# Patient Record
Sex: Female | Born: 1980 | Race: White | Hispanic: No | Marital: Married | Smoking: Former smoker
Health system: Southern US, Community
[De-identification: ages and names within clinical notes are randomized; demographics above are authoritative.]

## PROBLEM LIST (undated history)

## (undated) DIAGNOSIS — R51 Headache: Secondary | ICD-10-CM

## (undated) DIAGNOSIS — L2089 Other atopic dermatitis: Secondary | ICD-10-CM

## (undated) HISTORY — DX: Other atopic dermatitis: L20.89

## (undated) HISTORY — DX: Headache: R51

## (undated) HISTORY — PX: BREAST SURGERY: SHX581

---

## 2009-10-17 ENCOUNTER — Ambulatory Visit: Payer: Self-pay | Admitting: Family Medicine

## 2009-10-17 DIAGNOSIS — R51 Headache: Secondary | ICD-10-CM

## 2009-10-17 DIAGNOSIS — L2089 Other atopic dermatitis: Secondary | ICD-10-CM

## 2009-10-17 DIAGNOSIS — R519 Headache, unspecified: Secondary | ICD-10-CM | POA: Insufficient documentation

## 2009-10-17 HISTORY — DX: Other atopic dermatitis: L20.89

## 2009-10-17 HISTORY — DX: Headache: R51

## 2009-10-21 ENCOUNTER — Ambulatory Visit: Payer: Self-pay | Admitting: Family Medicine

## 2009-10-24 LAB — CONVERTED CEMR LAB
Calcium: 9.1 mg/dL (ref 8.4–10.5)
Cholesterol: 176 mg/dL (ref 0–200)
Creatinine, Ser: 0.9 mg/dL (ref 0.4–1.2)
Sodium: 138 meq/L (ref 135–145)
Triglycerides: 74 mg/dL (ref 0.0–149.0)

## 2009-12-17 ENCOUNTER — Telehealth: Payer: Self-pay | Admitting: Family Medicine

## 2010-03-20 ENCOUNTER — Telehealth: Payer: Self-pay | Admitting: Family Medicine

## 2010-04-28 NOTE — Assessment & Plan Note (Signed)
Summary: to be est/abd pain/njr   Vital Signs:  Patient profile:   30 year old female Menstrual status:  regular LMP:     10/06/2009 Height:      64 inches Weight:      176 pounds BMI:     30.32 Temp:     99.1 degrees F oral BP sitting:   128 / 80  (left arm) Cuff size:   regular  Vitals Entered By: Kern Reap CMA Duncan Dull) (October 17, 2009 4:01 PM) CC: new to establish LMP (date): 10/06/2009 LMP - Character: normal Menarche (age onset years): 10    Menstrual Status regular Enter LMP: 10/06/2009   CC:  new to establish.  History of Present Illness: Alyssa [name] is a 30 year old single female, nonsmoker, who comes in today as a new patient for evaluation of nausea x 3 months.  States she has nausea for the past two to 3 months.  She states is always worse at night when she lies down.  She has a history of lactase deficiency, but in the, past it's been a relative deficiency, and she's been able to consume some lactose, but not much.  she states that during the day.  She feels well.  It's just when she lies down.....Marland Kitchen also is not related to any particular food.  She eats  GI review of systems otherwise negative.  She also has eczema she would like checked.  She also has a history of migraine headaches since she was about 12.  Their daily during her menstrual cycle and a couple other times during the month.  She takes over-the-counter medications.  She is on BCPs last Pap March 2011 normal.  Tetanus booster within 6 years  Preventive Screening-Counseling & Management  Alcohol-Tobacco     Smoking Status: quit     Year Quit: 2009  Caffeine-Diet-Exercise     Does Patient Exercise: yes  Hep-HIV-STD-Contraception     Dental Visit-last 6 months yes      Drug Use:  no.    Allergies (verified): 1)  ! Penicillin  Past History:  Past medical, surgical, family and social histories (including risk factors) reviewed, and no changes noted (except as noted below).  Past Medical  History: Headache eczema  Past Surgical History: breast reduction  Family History: Reviewed history and no changes required. Father: heart disease, MI, HTN, DM, High Cholesterol Mother: hx breast cancer Siblings: 1  brother  Social History: Reviewed history and no changes required. Occupation: ultrasound tech Single Alcohol use-yes Drug use-no Regular exercise-yes Drug Use:  no Does Patient Exercise:  yes Dental Care w/in 6 mos.:  yes Smoking Status:  quit  Review of Systems      See HPI  Physical Exam  General:  Well-developed,well-nourished,in no acute distress; alert,appropriate and cooperative throughout examination Head:  Normocephalic and atraumatic without obvious abnormalities. No apparent alopecia or balding. Eyes:  No corneal or conjunctival inflammation noted. EOMI. Perrla. Funduscopic exam benign, without hemorrhages, exudates or papilledema. Vision grossly normal. Ears:  External ear exam shows no significant lesions or deformities.  Otoscopic examination reveals clear canals, tympanic membranes are intact bilaterally without bulging, retraction, inflammation or discharge. Hearing is grossly normal bilaterally. Nose:  External nasal examination shows no deformity or inflammation. Nasal mucosa are pink and moist without lesions or exudates. Mouth:  Oral mucosa and oropharynx without lesions or exudates.  Teeth in good repair. Neck:  No deformities, masses, or tenderness noted. Chest Wall:  No deformities, masses, or tenderness noted. Lungs:  Normal respiratory effort, chest expands symmetrically. Lungs are clear to auscultation, no crackles or wheezes. Heart:  Normal rate and regular rhythm. S1 and S2 normal without gallop, murmur, click, rub or other extra sounds. Abdomen:  Bowel sounds positive,abdomen soft and non-tender without masses, organomegaly or hernias noted. Msk:  No deformity or scoliosis noted of thoracic or lumbar spine.   Pulses:  R and L  carotid,radial,femoral,dorsalis pedis and posterior tibial pulses are full and equal bilaterally Extremities:  No clubbing, cyanosis, edema, or deformity noted with normal full range of motion of all joints.   Neurologic:  No cranial nerve deficits noted. Station and gait are normal. Plantar reflexes are down-going bilaterally. DTRs are symmetrical throughout. Sensory, motor and coordinative functions appear intact. Skin:  Intact without suspicious lesions or rashes Cervical Nodes:  No lymphadenopathy noted Axillary Nodes:  No palpable lymphadenopathy Inguinal Nodes:  No significant adenopathy Psych:  Cognition and judgment appear intact. Alert and cooperative with normal attention span and concentration. No apparent delusions, illusions, hallucinations   Problems:  Medical Problems Added: 1)  Dx of Eczema, Atopic  (ICD-691.8) 2)  Dx of Nausea  (ICD-787.02) 3)  Dx of Headache  (ICD-784.0)  Impression & Recommendations:  Problem # 1:  ECZEMA, ATOPIC (ICD-691.8) Assessment New  Her updated medication list for this problem includes:    Triamcinolone Acetonide 0.5 % Oint (Triamcinolone acetonide) .Marland Kitchen... Apply at bedtime  Orders: Prescription Created Electronically [phone])  Problem # 2:  NAUSEA (ICD-787.02) Assessment: New  Orders: Prescription Created Electronically [phone])  Problem # 3:  HEADACHE (ICD-784.0) Assessment: New  Complete Medication List: 1)  Yaz 3-0.02 Mg Tabs (Drospirenone-ethinyl estradiol) .... Take one tab by mouth once daily 2)  Omeprazole 20 Mg Tbec (Omeprazole) .... Take 1 tablet by mouth two times a day 3)  Triamcinolone Acetonide 0.5 % Oint (Triamcinolone acetonide) .... Apply at bedtime  Patient Instructions: 1)  4-y eczema, I would use a combination of udder cream and triamcinolone cream.  Small amounts at bedtime, and wear white cotton gloves. 2)  Dietary wise,  stay on a complete lactose-free diet and begin Prilosec 20 mg b.i.d..  If this stops her  symptoms, then continue the above is not after a week to 10 days.  Call us and we will get u  set up for a GI consulta day or 3)  Continue to use the over-the-counter medications for your migraine headaches.  If however, it does not work or you would like to discuss other options.  Call me Prescriptions: TRIAMCINOLONE ACETONIDE 0.5 % OINT (TRIAMCINOLONE ACETONIDE) apply at bedtime  #60 gr. x 3   Entered and Authorized by:   Roderick Pee MD   Signed by:   Roderick Pee MD on 10/17/2009   Method used:   Electronically to        [facility]  [phone]* (retail)       [address]       [place], [place]  [address]       Ph: [phone] or [phone]       Fax: [phone]   RxID:   [id number] OMEPRAZOLE 20 MG TBEC (OMEPRAZOLE) Take 1 tablet by mouth two times a day  #60 x 3   Entered and Authorized by:   Roderick Pee MD   Signed by:   Roderick Pee MD on 10/17/2009   Method used:   Electronically to  [facility]  [address]  [phone]* (retail)       [address]       [place], [place]  [address]       Ph: [phone] or [phone]       Fax: [phone]   RxID:   [id number]

## 2010-04-28 NOTE — Progress Notes (Signed)
Summary: Topamax?  Phone Note Call from Patient Call back at Home Phone [phone]   Caller: Patient Call For: Alyssa Pee MD Summary of Call: Pt is having more migraines, and would like to have a prescription for Topamax.  Nicolette Bang ([facility]). Initial call taken by: Lynann Beaver CMA,  December 17, 2009 10:37 AM  Follow-up for Phone Call        Topamax 50 mg, dispensed 30, directions one nightly no refills.  Follow-up office visit two weeks after starting medication Follow-up by: Alyssa Pee MD,  December 17, 2009 10:46 AM    New/Updated Medications: TOPAMAX 50 MG TABS (TOPIRAMATE) one by mouth q hs Prescriptions: TOPAMAX 50 MG TABS (TOPIRAMATE) one by mouth q hs  #30 x 0   Entered by:   Lynann Beaver CMA   Authorized by:   Alyssa Pee MD   Signed by:   Lynann Beaver CMA on 12/17/2009   Method used:   Electronically to        [facility]  [phone]* (retail)       [address]       [place], [place]  [address]       Ph: [phone] or [phone]       Fax: [phone]   RxID:   [id number]

## 2010-04-30 NOTE — Progress Notes (Signed)
Summary: diuretic?  Phone Note Call from Patient Call back at Home Phone [phone]   Caller: Patient Call For: Roderick Pee MD Summary of Call: Pt left message asking for fluid pill for retained fluid????  Advised pt that she needs to make an appt to see why she might be retaining fluid at her age.  Dr. Tawanna Cooler prefers to see his patients without prescribing over the phone. Initial call taken by: Lynann Beaver CMA AAMA,  March 20, 2010 11:18 AM  Follow-up for Phone Call        Fleet Contras please call Follow-up by: Roderick Pee MD,  March 20, 2010 11:26 AM  Additional Follow-up for Phone Call Additional follow up Details #1::        patient is complaing of ankle swelling during her period.  no SOB or elevated blood pressure. Additional Follow-up by: Kern Reap CMA Duncan Dull),  March 20, 2010 12:52 PM    Additional Follow-up for Phone Call Additional follow up Details #2::    patient was advised to cut back on sodium in her diet.  Also HCTZ per dr todd Follow-up by: Kern Reap CMA Duncan Dull),  March 20, 2010 12:53 PM  New/Updated Medications: HYDROCHLOROTHIAZIDE 25 MG TABS (HYDROCHLOROTHIAZIDE) take one tab once daily 10 days before period Prescriptions: HYDROCHLOROTHIAZIDE 25 MG TABS (HYDROCHLOROTHIAZIDE) take one tab once daily 10 days before period  #100 x 3   Entered by:   Kern Reap CMA (AAMA)   Authorized by:   Roderick Pee MD   Signed by:   Kern Reap CMA (AAMA) on 03/20/2010   Method used:   Electronically to        [facility]  [phone]* (retail)       [address]       [place], [place]  [address]       Ph: [phone] or [phone]       Fax: [phone]   RxID:   [id number]

## 2010-05-21 ENCOUNTER — Other Ambulatory Visit: Payer: Self-pay | Admitting: Family Medicine

## 2010-09-03 ENCOUNTER — Other Ambulatory Visit: Payer: Self-pay | Admitting: Family Medicine

## 2010-10-26 ENCOUNTER — Other Ambulatory Visit: Payer: Self-pay | Admitting: Family Medicine

## 2010-10-26 ENCOUNTER — Other Ambulatory Visit (INDEPENDENT_AMBULATORY_CARE_PROVIDER_SITE_OTHER): Payer: BC Managed Care – PPO

## 2010-10-26 DIAGNOSIS — Z0000 Encounter for general adult medical examination without abnormal findings: Secondary | ICD-10-CM

## 2010-10-26 LAB — URINALYSIS
Hgb urine dipstick: NEGATIVE
Nitrite: NEGATIVE
Specific Gravity, Urine: 1.025 (ref 1.000–1.030)
Total Protein, Urine: NEGATIVE
Urobilinogen, UA: 0.2 (ref 0.0–1.0)

## 2010-10-26 LAB — BASIC METABOLIC PANEL
BUN: 15 mg/dL (ref 6–23)
Creatinine, Ser: 0.8 mg/dL (ref 0.4–1.2)
GFR: 92.81 mL/min (ref >60.00)
Glucose, Bld: 80 mg/dL (ref 70–99)
Potassium: 3.4 mEq/L — ABNORMAL LOW (ref 3.5–5.1)

## 2010-10-26 LAB — CBC WITH DIFFERENTIAL/PLATELET
Basophils Relative: 0.4 % (ref 0.0–3.0)
Eosinophils Relative: 0.8 % (ref 0.0–5.0)
HCT: 42.3 % (ref 36.0–46.0)
Hemoglobin: 14.5 g/dL (ref 12.0–15.0)
Lymphs Abs: 3.1 10*3/uL (ref 0.7–4.0)
Monocytes Relative: 11.3 % (ref 3.0–12.0)
Neutro Abs: 2.1 10*3/uL (ref 1.4–7.7)
WBC: 6 10*3/uL (ref 4.5–10.5)

## 2010-10-26 LAB — TSH: TSH: 1.89 u[IU]/mL (ref 0.35–5.50)

## 2010-10-26 LAB — HEPATIC FUNCTION PANEL
Bilirubin, Direct: 0.1 mg/dL (ref 0.0–0.3)
Total Bilirubin: 0.7 mg/dL (ref 0.3–1.2)

## 2010-10-26 LAB — LIPID PANEL: VLDL: 13.2 mg/dL (ref 0.0–40.0)

## 2010-10-28 ENCOUNTER — Encounter: Payer: Self-pay | Admitting: Family Medicine

## 2010-11-02 ENCOUNTER — Encounter: Payer: Self-pay | Admitting: Family Medicine

## 2010-11-02 ENCOUNTER — Ambulatory Visit (INDEPENDENT_AMBULATORY_CARE_PROVIDER_SITE_OTHER): Payer: BC Managed Care – PPO | Admitting: Family Medicine

## 2010-11-02 DIAGNOSIS — R51 Headache: Secondary | ICD-10-CM

## 2010-11-02 DIAGNOSIS — L2089 Other atopic dermatitis: Secondary | ICD-10-CM

## 2010-11-02 DIAGNOSIS — K219 Gastro-esophageal reflux disease without esophagitis: Secondary | ICD-10-CM

## 2010-11-02 MED ORDER — HYDROCHLOROTHIAZIDE 25 MG PO TABS: 25.0000 mg | ORAL_TABLET | Freq: Every day | ORAL | Status: DC

## 2010-11-02 MED ORDER — TRIAMCINOLONE ACETONIDE 0.5 % EX OINT: 1.0000 "application " | TOPICAL_OINTMENT | Freq: Every day | CUTANEOUS | Status: DC

## 2010-11-02 MED ORDER — OMEPRAZOLE 20 MG PO CPDR: 20.0000 mg | DELAYED_RELEASE_CAPSULE | Freq: Every day | ORAL | Status: DC

## 2010-11-02 NOTE — Progress Notes (Signed)
  Subjective:    Patient ID: Alyssa [name], female    DOB: [birth year], 30 y.o.   MRN: [id number]  HPI Silver is a delightful 30 year old female, nonsmoker, who works at [employer], and comes in today for general physical examination because a history of reflux esophagitis migraine headaches and eczema.  Her reflux esophagitis is controlled with Prilosec 20 daily however, if she doesn't take it.  She has symptoms.  We discussed other diagnostic studies however, she is asymptomatic on a medicine.  She has a history of migraine headaches.  We stride, Topamax 50 mg, however, it made her too sleepy and she stopped it.  She currently just takes Motrin or other OTC medications, and they seem to work.  She takes triamcinolone ointment p.r.n. For ectopic eczema.  Usually involving her hands and knees and feet.  She is currently on BCPs via GYN.  The mother and maternal grandmother both had breast cancer.  Her mother was diagnosed with breast cancer at age 68.  We advise to start regular mammography.  Now.  She had a workup and was bracca  negative   Review of Systems General review of systems otherwise negative    Objective:   Physical Exam Well-developed well-nourished, female, in no acute distress.  Examination of the HEENT were negative.  Neck was supple.  Thyroid not enlarged.  No adenopathy.  Lungs are clear.  Cardiac exam negative.  Breast exam shows the breast to be mostly symmetrical.  There is scarring from previous reduction mammoplasty.  I can palpate no abnormal lesions.  Abdominal exam is negative.  Extremities normal.  Skin normal.  Peripheral pulses normal       Assessment & Plan:  Healthy female.  Migraine headaches, currently controlled with over-the-counter medication.  An eczema.  Continue Kenalog ointment p.r.n.  Reflux esophagitis.  Continue Prilosec 20 mg daily.  Fluid retention.   thiazide 25 mg p.r.n.  Positive family history of breast cancer.  Recommend  BSE monthly and a new mammography starting now

## 2010-11-02 NOTE — Patient Instructions (Signed)
Continue your current good health habits return in one year, sooner if any problems.  Call the breast Center and get set up for a mammogram

## 2010-11-04 ENCOUNTER — Other Ambulatory Visit: Payer: Self-pay | Admitting: *Deleted

## 2010-11-04 DIAGNOSIS — L2089 Other atopic dermatitis: Secondary | ICD-10-CM

## 2010-11-04 MED ORDER — TRIAMCINOLONE ACETONIDE 0.5 % EX OINT: 1.0000 "application " | TOPICAL_OINTMENT | Freq: Two times a day (BID) | CUTANEOUS | Status: DC

## 2011-02-25 ENCOUNTER — Other Ambulatory Visit: Payer: Self-pay | Admitting: Family Medicine

## 2011-04-08 ENCOUNTER — Other Ambulatory Visit: Payer: Self-pay | Admitting: *Deleted

## 2011-04-08 DIAGNOSIS — R51 Headache: Secondary | ICD-10-CM

## 2011-04-08 MED ORDER — HYDROCHLOROTHIAZIDE 25 MG PO TABS: 25.0000 mg | ORAL_TABLET | Freq: Every day | ORAL | Status: DC

## 2011-08-28 ENCOUNTER — Other Ambulatory Visit: Payer: Self-pay | Admitting: Family Medicine

## 2011-09-26 ENCOUNTER — Other Ambulatory Visit: Payer: Self-pay | Admitting: Family Medicine

## 2011-10-04 ENCOUNTER — Telehealth: Payer: Self-pay | Admitting: Family Medicine

## 2011-10-04 DIAGNOSIS — R51 Headache: Secondary | ICD-10-CM

## 2011-10-04 DIAGNOSIS — K219 Gastro-esophageal reflux disease without esophagitis: Secondary | ICD-10-CM

## 2011-10-04 NOTE — Telephone Encounter (Signed)
Caller: Alyssa [name]/Patient; Phone Number: [phone]; Message from caller: Her insurance has changed and she now has access to [facility].  Asking that scripts for HCTZ and Omeprazole be sent for a 90 day supply.  The fax # is [phone] for electronic faxing.

## 2011-10-05 MED ORDER — HYDROCHLOROTHIAZIDE 25 MG PO TABS: 25.0000 mg | ORAL_TABLET | Freq: Every day | ORAL | Status: DC

## 2011-10-05 MED ORDER — OMEPRAZOLE 20 MG PO CPDR: 20.0000 mg | DELAYED_RELEASE_CAPSULE | Freq: Every day | ORAL | Status: DC

## 2011-11-02 ENCOUNTER — Telehealth: Payer: Self-pay | Admitting: Family Medicine

## 2011-11-02 MED ORDER — OMEPRAZOLE 20 MG PO CPDR: 20.0000 mg | DELAYED_RELEASE_CAPSULE | Freq: Two times a day (BID) | ORAL | Status: DC

## 2011-11-02 NOTE — Telephone Encounter (Signed)
confidential Office Message [address] [place], [place] [address] p. [phone] f. [phone] To: [facility] (After Hours Triage) Fax: [phone] From: [facility] Date/ Time: 11/01/2011 6:01 PM Taken By: Alyssa Bow, RN Caller: Alyssa [name] Facility: not collected Patient: Alyssa, [name] DOB: 1980-09-14 Phone: [phone] Reason for Call: Caller: Alyssa [name]/Patient; PCP: Alyssa [name] ; CB#: [phone]; ; ; Call regarding Omeprazole Quantity Not Correct, Pt needs quantity #180 due to taking twice a day. RX written for daily per PT. Advised Patient to follow up with Office when reopens on 8-6. Patient verbalized understanding.

## 2011-11-02 NOTE — Telephone Encounter (Signed)
Refill sent.  Office visit for more refills.

## 2011-12-01 ENCOUNTER — Telehealth: Payer: Self-pay | Admitting: Family Medicine

## 2011-12-01 DIAGNOSIS — Z0000 Encounter for general adult medical examination without abnormal findings: Secondary | ICD-10-CM

## 2011-12-01 NOTE — Telephone Encounter (Signed)
Pt called and has sch a cpx for 02/07/12 at 2:45pm. Pt is req to get her cpx labs done at [facility] because pt has to be at work at 7:30am, so its more convenient for the pts schd. Pls order labs for [facility].

## 2012-01-25 ENCOUNTER — Telehealth: Payer: Self-pay | Admitting: Family Medicine

## 2012-01-25 NOTE — Telephone Encounter (Signed)
Error/kjh 

## 2012-02-02 ENCOUNTER — Other Ambulatory Visit (INDEPENDENT_AMBULATORY_CARE_PROVIDER_SITE_OTHER): Payer: No Typology Code available for payment source

## 2012-02-02 DIAGNOSIS — Z0000 Encounter for general adult medical examination without abnormal findings: Secondary | ICD-10-CM

## 2012-02-02 LAB — CBC WITH DIFFERENTIAL/PLATELET
Basophils Relative: 0.5 % (ref 0.0–3.0)
Eosinophils Absolute: 0 10*3/uL (ref 0.0–0.7)
Eosinophils Relative: 0.5 % (ref 0.0–5.0)
Hemoglobin: 14.6 g/dL (ref 12.0–15.0)
Lymphocytes Relative: 42.1 % (ref 12.0–46.0)
MCHC: 33.9 g/dL (ref 30.0–36.0)
Monocytes Relative: 11.6 % (ref 3.0–12.0)
Neutrophils Relative %: 45.3 % (ref 43.0–77.0)
RBC: 4.7 Mil/uL (ref 3.87–5.11)
WBC: 4.9 10*3/uL (ref 4.5–10.5)

## 2012-02-02 LAB — URINALYSIS
Ketones, ur: NEGATIVE
Specific Gravity, Urine: 1.025 (ref 1.000–1.030)
Urine Glucose: NEGATIVE
Urobilinogen, UA: 0.2 (ref 0.0–1.0)

## 2012-02-02 LAB — BASIC METABOLIC PANEL
BUN: 15 mg/dL (ref 6–23)
GFR: 77.89 mL/min (ref >60.00)
Potassium: 4.3 mEq/L (ref 3.5–5.1)
Sodium: 139 mEq/L (ref 135–145)

## 2012-02-02 LAB — LIPID PANEL
Cholesterol: 213 mg/dL — ABNORMAL HIGH (ref 0–200)
VLDL: 16 mg/dL (ref 0.0–40.0)

## 2012-02-02 LAB — HEPATIC FUNCTION PANEL
Albumin: 3.9 g/dL (ref 3.5–5.2)
Total Protein: 6.9 g/dL (ref 6.0–8.3)

## 2012-02-02 LAB — TSH: TSH: 1.32 u[IU]/mL (ref 0.35–5.50)

## 2012-02-07 ENCOUNTER — Ambulatory Visit (INDEPENDENT_AMBULATORY_CARE_PROVIDER_SITE_OTHER): Payer: No Typology Code available for payment source | Admitting: Family Medicine

## 2012-02-07 ENCOUNTER — Encounter: Payer: Self-pay | Admitting: Family Medicine

## 2012-02-07 VITALS — BP 120/80 | Temp 99.2°F | Wt 207.0 lb

## 2012-02-07 DIAGNOSIS — L2089 Other atopic dermatitis: Secondary | ICD-10-CM

## 2012-02-07 DIAGNOSIS — K219 Gastro-esophageal reflux disease without esophagitis: Secondary | ICD-10-CM

## 2012-02-07 DIAGNOSIS — Z803 Family history of malignant neoplasm of breast: Secondary | ICD-10-CM

## 2012-02-07 DIAGNOSIS — R51 Headache: Secondary | ICD-10-CM

## 2012-02-07 DIAGNOSIS — K120 Recurrent oral aphthae: Secondary | ICD-10-CM | POA: Insufficient documentation

## 2012-02-07 DIAGNOSIS — N946 Dysmenorrhea, unspecified: Secondary | ICD-10-CM

## 2012-02-07 MED ORDER — TRIAMCINOLONE ACETONIDE 0.5 % EX OINT: 1.0000 "application " | TOPICAL_OINTMENT | Freq: Two times a day (BID) | CUTANEOUS | Status: DC

## 2012-02-07 MED ORDER — OMEPRAZOLE 20 MG PO CPDR: 20.0000 mg | DELAYED_RELEASE_CAPSULE | Freq: Two times a day (BID) | ORAL | Status: DC

## 2012-02-07 MED ORDER — ACYCLOVIR 200 MG PO CAPS: ORAL_CAPSULE | ORAL | Status: DC

## 2012-02-07 MED ORDER — HYDROCHLOROTHIAZIDE 25 MG PO TABS: 25.0000 mg | ORAL_TABLET | Freq: Every day | ORAL | Status: DC

## 2012-02-07 NOTE — Progress Notes (Signed)
  Subjective:    Patient ID: Alyssa [name], female    DOB: 1980/12/03, 31 y.o.   MRN: [id number]  HPI Alyssa [name] is a 31 year old single female nonsmoker who comes in today for evaluation of reflux esophagitis, recurrent canker sores, eczema.  She is on Wellbutrin 300 mg daily by her GYN along with hydrochlorothiazide for premenstrual fluid retention. She takes Prilosec 20 mg daily for reflux uses Kenalog when necessary for eczema. Her GYN recently changed her BCPs however she's having trouble with weight gain. Her weight today was 207 pounds. Suggested she talk with Dr. Jackelyn Knife about an IUD  She also has recurrent canker sores once notice anything else she can try.  She gets regular eye his care, dental care, BSE monthly,,,,,,,,,, history of reduction mammoplasty,,,,,,,, tetanus 2013 seasonal flu shot 2013.  Her mother was diagnosed at age 49 with breast cancer also her maternal mother had breast cancer her mother is br,,, negative   Review of Systems  Constitutional: Negative.   HENT: Negative.   Eyes: Negative.   Respiratory: Negative.   Cardiovascular: Negative.   Gastrointestinal: Negative.   Genitourinary: Negative.   Musculoskeletal: Negative.   Neurological: Negative.   Hematological: Negative.   Psychiatric/Behavioral: Negative.        Objective:   Physical Exam  Constitutional: She appears well-developed and well-nourished.  HENT:  Head: Normocephalic and atraumatic.  Right Ear: External ear normal.  Left Ear: External ear normal.  Nose: Nose normal.  Mouth/Throat: Oropharynx is clear and moist.  Eyes: EOM are normal. Pupils are equal, round, and reactive to light.  Neck: Normal range of motion. Neck supple. No thyromegaly present.  Cardiovascular: Normal rate, regular rhythm, normal heart sounds and intact distal pulses.  Exam reveals no gallop and no friction rub.   No murmur heard. Pulmonary/Chest: Effort normal and breath sounds normal.  Abdominal: Soft.  Bowel sounds are normal. She exhibits no distension and no mass. There is no tenderness. There is no rebound.  Musculoskeletal: Normal range of motion.  Lymphadenopathy:    She has no cervical adenopathy.  Neurological: She is alert. She has normal reflexes. No cranial nerve deficit. She exhibits normal muscle tone. Coordination normal.  Skin: Skin is warm and dry.       Total body skin exam normal  Psychiatric: She has a normal mood and affect. Her behavior is normal. Judgment and thought content normal.          Assessment & Plan:  Healthy female  Reflux esophagitis continue Prilosec  Dysmenorrhea continue BCPs Wellbutrin hydrochlorothiazide discuss with GYN an IUD  Overweight discussed diet exercise and weight loss  Canker sores a trial of Zovirax  Family history of breast cancer mother and grandmother advised BSE monthly begin mammography at age 63

## 2012-02-07 NOTE — Patient Instructions (Signed)
Continue your current medications  Add acyclovir 1,,,,,,,,,,,,,,3 times daily when necessary for canker sores  Remember to check your freckles and moles monthly wear SPF 50 sunscreens  Do a thorough breast exam monthly  Return if you suspect any changes or not sure and I would recommend beginning mammography at age 31

## 2012-09-06 ENCOUNTER — Other Ambulatory Visit: Payer: Self-pay | Admitting: Family Medicine

## 2012-11-17 ENCOUNTER — Telehealth: Payer: Self-pay | Admitting: Family Medicine

## 2012-11-17 DIAGNOSIS — Z0000 Encounter for general adult medical examination without abnormal findings: Secondary | ICD-10-CM

## 2012-11-17 NOTE — Telephone Encounter (Signed)
For work purposes, pt needs to go to [facility]  for her cpe labs. CPE  Feb 12, 2013. Can you put in?

## 2013-02-01 ENCOUNTER — Other Ambulatory Visit: Payer: Self-pay

## 2013-02-05 ENCOUNTER — Other Ambulatory Visit (INDEPENDENT_AMBULATORY_CARE_PROVIDER_SITE_OTHER): Payer: No Typology Code available for payment source

## 2013-02-05 DIAGNOSIS — Z0000 Encounter for general adult medical examination without abnormal findings: Secondary | ICD-10-CM

## 2013-02-05 LAB — CBC WITH DIFFERENTIAL/PLATELET
Basophils Absolute: 0 10*3/uL (ref 0.0–0.1)
Basophils Relative: 0.5 % (ref 0.0–3.0)
Eosinophils Absolute: 0 10*3/uL (ref 0.0–0.7)
Lymphocytes Relative: 35 % (ref 12.0–46.0)
MCHC: 34.7 g/dL (ref 30.0–36.0)
MCV: 92.9 fl (ref 78.0–100.0)
Monocytes Absolute: 0.5 10*3/uL (ref 0.1–1.0)
Neutro Abs: 2.8 10*3/uL (ref 1.4–7.7)
Neutrophils Relative %: 54 % (ref 43.0–77.0)
RBC: 4.58 Mil/uL (ref 3.87–5.11)
RDW: 12.2 % (ref 11.5–14.6)

## 2013-02-05 LAB — LIPID PANEL
Cholesterol: 186 mg/dL (ref 0–200)
HDL: 48.7 mg/dL (ref >39.00)
Total CHOL/HDL Ratio: 4
Triglycerides: 28 mg/dL (ref 0.0–149.0)

## 2013-02-05 LAB — HEPATIC FUNCTION PANEL
AST: 16 U/L (ref 0–37)
Albumin: 4.5 g/dL (ref 3.5–5.2)
Alkaline Phosphatase: 29 U/L — ABNORMAL LOW (ref 39–117)
Bilirubin, Direct: 0 mg/dL (ref 0.0–0.3)
Total Protein: 7.2 g/dL (ref 6.0–8.3)

## 2013-02-05 LAB — BASIC METABOLIC PANEL
CO2: 25 mEq/L (ref 19–32)
Calcium: 9.3 mg/dL (ref 8.4–10.5)
Creatinine, Ser: 0.9 mg/dL (ref 0.4–1.2)
GFR: 77.41 mL/min (ref >60.00)
Sodium: 139 mEq/L (ref 135–145)

## 2013-02-05 LAB — URINALYSIS
Bilirubin Urine: NEGATIVE
Ketones, ur: NEGATIVE
Leukocytes, UA: NEGATIVE
Specific Gravity, Urine: 1.025 (ref 1.000–1.030)
Total Protein, Urine: NEGATIVE
Urine Glucose: NEGATIVE
pH: 6 (ref 5.0–8.0)

## 2013-02-12 ENCOUNTER — Other Ambulatory Visit: Payer: Self-pay | Admitting: *Deleted

## 2013-02-12 ENCOUNTER — Encounter: Payer: No Typology Code available for payment source | Admitting: Family Medicine

## 2013-02-12 DIAGNOSIS — K219 Gastro-esophageal reflux disease without esophagitis: Secondary | ICD-10-CM

## 2013-02-12 DIAGNOSIS — N946 Dysmenorrhea, unspecified: Secondary | ICD-10-CM

## 2013-02-12 MED ORDER — OMEPRAZOLE 20 MG PO CPDR: 20.0000 mg | DELAYED_RELEASE_CAPSULE | Freq: Two times a day (BID) | ORAL | Status: DC

## 2013-02-12 MED ORDER — HYDROCHLOROTHIAZIDE 25 MG PO TABS: 25.0000 mg | ORAL_TABLET | Freq: Every day | ORAL | Status: DC

## 2013-02-12 MED ORDER — ACYCLOVIR 200 MG PO CAPS: ORAL_CAPSULE | ORAL | Status: DC

## 2013-03-05 ENCOUNTER — Telehealth: Payer: Self-pay | Admitting: Family Medicine

## 2013-03-05 ENCOUNTER — Ambulatory Visit (INDEPENDENT_AMBULATORY_CARE_PROVIDER_SITE_OTHER): Payer: BC Managed Care – PPO | Admitting: Family Medicine

## 2013-03-05 ENCOUNTER — Encounter: Payer: Self-pay | Admitting: Family Medicine

## 2013-03-05 VITALS — BP 120/80 | Temp 98.6°F | Ht 64.25 in | Wt 186.0 lb

## 2013-03-05 DIAGNOSIS — R51 Headache: Secondary | ICD-10-CM

## 2013-03-05 DIAGNOSIS — K120 Recurrent oral aphthae: Secondary | ICD-10-CM

## 2013-03-05 DIAGNOSIS — Z803 Family history of malignant neoplasm of breast: Secondary | ICD-10-CM

## 2013-03-05 DIAGNOSIS — K219 Gastro-esophageal reflux disease without esophagitis: Secondary | ICD-10-CM

## 2013-03-05 DIAGNOSIS — N946 Dysmenorrhea, unspecified: Secondary | ICD-10-CM

## 2013-03-05 MED ORDER — BUPROPION HCL ER (XL) 300 MG PO TB24: 300.0000 mg | ORAL_TABLET | Freq: Every day | ORAL | Status: DC

## 2013-03-05 MED ORDER — RIZATRIPTAN BENZOATE 5 MG PO TBDP: 5.0000 mg | ORAL_TABLET | ORAL | Status: DC | PRN

## 2013-03-05 NOTE — Patient Instructions (Signed)
Continue your current medications  Call Maylon Cos at the genetic center for consultation because her mother was diagnosed of breast cancer at age 33.  Do a thorough breast exam monthly and began and you mammography now  Return sometime this winter for removal of the mole that's Tailor on your left lower abdominal area    Maxalt 5 mg sublingual when necessary at the onset of any migraine

## 2013-03-05 NOTE — Progress Notes (Signed)
Subjective:    Patient ID: Alyssa [name], female    DOB: 17-Aug-1980, 32 y.o.   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 Her mother is a survivor. Her mother's breast cancer presented as dimpling. Her mother is now 83. She's never had angina headache evaluation.  She also has migraine headaches about twice a month for which she's taken over-the-counter Motrin. However the Motrin is causing a lot of bruising.  She has recurrent canker sores which she takes acyclovir when necessary  She also takes Wellbutrin 300 mg daily.  She's off her BCPs she had a Mirena IUD inserted by her GYN  She also takes hydrochlorothiazide 25 mg daily for fluid retention and Kenalog 0.5% cream for eczema.  She's tried Topamax in the past for migraines but she had side effects. She's never tried Maxalt   Review of Systems  Constitutional: Negative.   HENT: Negative.   Eyes: Negative.   Respiratory: Negative.   Cardiovascular: Negative.   Gastrointestinal: Negative.   Endocrine: Negative.   Genitourinary: Negative.   Musculoskeletal: Negative.   Allergic/Immunologic: Negative.   Neurological: Negative.   Hematological: Negative.   Psychiatric/Behavioral: Negative.        Objective:   Physical Exam  Constitutional: She is oriented to person, place, and time. She appears well-developed and well-nourished.  HENT:  Head: Normocephalic and atraumatic.  Right Ear: External ear normal.  Left Ear: External ear normal.  Nose: Nose normal.  Mouth/Throat: Oropharynx is clear and moist.  Eyes: EOM are normal. Pupils are equal, round, and reactive to light.  Neck: Normal range of motion. Neck supple. No thyromegaly present.    Cardiovascular: Normal rate, regular rhythm, normal heart sounds and intact distal pulses.  Exam reveals no gallop and no friction rub.   No murmur heard. Pulmonary/Chest: Effort normal and breath sounds normal.  Abdominal: Soft. Bowel sounds are normal. She exhibits no distension and no mass. There is no tenderness. There is no rebound.  Genitourinary:  Bilateral breast exam shows scars from the 3 to the 9:00 position inferiorly from previous reduction mammoplasty. The components of BSE were taught and at age 33 she was advised to begin mammography since her mother had breast cancer at age 37. Thorough breast exam was normal  Musculoskeletal: Normal range of motion.  Lymphadenopathy:    She has no cervical adenopathy.  Neurological: She is alert and oriented to person, place, and time. She has normal reflexes. No cranial nerve deficit. She exhibits normal muscle tone. Coordination normal.  Skin: Skin is warm and dry.  Total body skin exam normal except for a 3 mm Mcclenahan lesion anterior left lower abdomen advised to return for removal this winter  Psychiatric: She has a normal mood and affect. Her behavior is normal. Judgment and thought content normal.          Assessment & Plan:  Healthy female  Reflux esophagitis continue Prilosec  Dysmenorrhea resolved with a Mirena IUD  Positive family history of breast cancer in mom at age 27 refer to Maylon Cos at the [facility] for further studies and evaluation  Canker sores acyclovir when necessary  Eczema and triamcinolone cream when necessary  Migraine headaches stop the Motrin  trial of Maxalt

## 2013-03-05 NOTE — Telephone Encounter (Signed)
Pt received your message, and unless you need anything else, pt will see you when she has her mole removed!! Call if you need her, and many thanks!!

## 2013-03-05 NOTE — Progress Notes (Signed)
Pre visit review using our clinic review tool, if applicable. No additional management support is needed unless otherwise documented below in the visit note. 

## 2013-05-11 ENCOUNTER — Other Ambulatory Visit: Payer: Self-pay | Admitting: *Deleted

## 2013-05-11 DIAGNOSIS — N946 Dysmenorrhea, unspecified: Secondary | ICD-10-CM

## 2013-05-11 DIAGNOSIS — K219 Gastro-esophageal reflux disease without esophagitis: Secondary | ICD-10-CM

## 2013-05-11 MED ORDER — HYDROCHLOROTHIAZIDE 25 MG PO TABS: 25.0000 mg | ORAL_TABLET | Freq: Every day | ORAL | Status: DC

## 2013-05-11 MED ORDER — ACYCLOVIR 200 MG PO CAPS: ORAL_CAPSULE | ORAL | Status: DC

## 2013-05-11 MED ORDER — OMEPRAZOLE 20 MG PO CPDR: 20.0000 mg | DELAYED_RELEASE_CAPSULE | Freq: Two times a day (BID) | ORAL | Status: DC

## 2013-06-01 ENCOUNTER — Telehealth: Payer: Self-pay | Admitting: Family Medicine

## 2013-06-01 DIAGNOSIS — N946 Dysmenorrhea, unspecified: Secondary | ICD-10-CM

## 2013-06-01 NOTE — Telephone Encounter (Signed)
Pt states she received a letter from [facility] stating they could not fill the rx because they need verification on the quantity for rxhydrochlorothiazide (HYDRODIURIL) 25 MG tablet,

## 2013-06-04 MED ORDER — HYDROCHLOROTHIAZIDE 25 MG PO TABS: 25.0000 mg | ORAL_TABLET | Freq: Every day | ORAL | Status: DC

## 2013-06-04 NOTE — Telephone Encounter (Signed)
New directions for Rx. Left message on machine for patient.

## 2013-08-24 ENCOUNTER — Telehealth: Payer: Self-pay | Admitting: Family Medicine

## 2013-08-24 NOTE — Telephone Encounter (Signed)
Pt requesting call back from nurse, states she has questions regarding an issue that has been going on with her for a while. Pt states she is noticing that she is having trouble focusing and just has a couple of questions.

## 2013-08-27 NOTE — Telephone Encounter (Signed)
Left message on machine returning patient's call 

## 2013-08-28 NOTE — Telephone Encounter (Signed)
Left message on machine returning patient's call 

## 2013-08-30 NOTE — Telephone Encounter (Signed)
Spoke with patient and an appointment made 

## 2013-09-05 ENCOUNTER — Ambulatory Visit (INDEPENDENT_AMBULATORY_CARE_PROVIDER_SITE_OTHER): Payer: BC Managed Care – PPO | Admitting: Family Medicine

## 2013-09-05 ENCOUNTER — Encounter: Payer: Self-pay | Admitting: Family Medicine

## 2013-09-05 VITALS — BP 120/80

## 2013-09-05 DIAGNOSIS — F988 Other specified behavioral and emotional disorders with onset usually occurring in childhood and adolescence: Secondary | ICD-10-CM

## 2013-09-05 MED ORDER — AMPHETAMINE-DEXTROAMPHET ER 20 MG PO CP24: 20.0000 mg | ORAL_CAPSULE | Freq: Every day | ORAL | Status: DC

## 2013-09-05 NOTE — Patient Instructions (Signed)
Adderall 20 mg long-acting.......Marland Kitchen 1 daily in the morning  Return in 2-3 weeks for followup

## 2013-09-05 NOTE — Progress Notes (Signed)
   Subjective:    Patient ID: Alyssa [name], female    DOB: Sep 09, 1980, 33 y.o.   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 This was noticed in high school. She went to [employer] and did very poorly in reading. She did well in math. She went to [employer] and had difficulty with focusing concentration and again reading.  She has trouble when she has to deal with more than one thing at a time. When she 54 with multiple issues she often times forget switch she's doing.  We discussed the concept of ADD diagnostic and treatment options. She elects of trauma medication for one month prior to any formal testing  She's not taken BCPs her gynecologist for an IUD in. She takes Prilosec twice daily. She uses over-the-counter medication for migraines. She had side effects from the Maxalt. She's also off Wellbutrin. She takes acyclovir when necessary   Review of Systems    review of systems otherwise negative Objective:   Physical Exam  Well-developed well-nourished female no acute distress vital signs stable she's afebrile      Assessment & Plan:  Symptoms consistent with ADD,,,,,,,,, trial of Adderall return in 3 weeks for followup,

## 2013-09-24 ENCOUNTER — Encounter: Payer: Self-pay | Admitting: Family Medicine

## 2013-09-24 ENCOUNTER — Ambulatory Visit (INDEPENDENT_AMBULATORY_CARE_PROVIDER_SITE_OTHER): Payer: BC Managed Care – PPO | Admitting: Family Medicine

## 2013-09-24 ENCOUNTER — Telehealth: Payer: Self-pay | Admitting: Family Medicine

## 2013-09-24 VITALS — BP 120/80 | Temp 98.7°F | Wt 185.0 lb

## 2013-09-24 DIAGNOSIS — F988 Other specified behavioral and emotional disorders with onset usually occurring in childhood and adolescence: Secondary | ICD-10-CM

## 2013-09-24 DIAGNOSIS — Z0000 Encounter for general adult medical examination without abnormal findings: Secondary | ICD-10-CM

## 2013-09-24 MED ORDER — AMPHETAMINE-DEXTROAMPHET ER 20 MG PO CP24: 20.0000 mg | ORAL_CAPSULE | ORAL | Status: DC

## 2013-09-24 MED ORDER — AMPHETAMINE-DEXTROAMPHET ER 20 MG PO CP24: 20.0000 mg | ORAL_CAPSULE | Freq: Every day | ORAL | Status: DC

## 2013-09-24 NOTE — Telephone Encounter (Signed)
Lab orders placed.  

## 2013-09-24 NOTE — Patient Instructions (Signed)
Adderall 20 mg long-acting.............Marland Kitchen. 1 daily in the morning  When you are 2 weeks from being out of your third prescription call and leave a voicemail with Fleet ContrasRachel for refills.

## 2013-09-24 NOTE — Telephone Encounter (Signed)
Pt is going to [facility] for CP[facility] labs in December.

## 2013-09-24 NOTE — Progress Notes (Signed)
   Subjective:    Patient ID: Alyssa [name], female    DOB: 11-Mar-1981, 33 y.o.   MRN: [id number]  HPI Alyssa JoinerRebecca is a 33 year old female nonsmoker who comes in today for followup of adult ADD  Historically she has had ADD since she was a teenager. She was in the [employer] courses for math but had difficulty reading. Her mother has the same problem  We saw her couple weeks ago because she was having a lot of trouble work focusing concentrating and getting things done. We started her on a 20 mg tablet long-acting daily and she comes back today for followup. She says this is a "miracle" drug. It's helped her tremendously no side effects except for slight headache that went away with time    Review of Systems    review of systems negative no weight loss Objective:   Physical Exam  Well-developed well-nourished female no acute distress vital signs stable she's afebrile BP normal 120/80      Assessment & Plan:  Adult ADD............ continue Adderall long-acting 20 mg daily

## 2013-12-06 ENCOUNTER — Telehealth: Payer: Self-pay | Admitting: Family Medicine

## 2013-12-06 MED ORDER — PREDNISONE 20 MG PO TABS: 20.0000 mg | ORAL_TABLET | Freq: Every day | ORAL | Status: DC

## 2013-12-06 MED ORDER — TRAMADOL HCL 50 MG PO TABS: 50.0000 mg | ORAL_TABLET | Freq: Every day | ORAL | Status: DC

## 2013-12-06 NOTE — Telephone Encounter (Signed)
Left detailed message on machine for patient.  Per Dr Tawanna Cooler patient can try Prednisone 20 mg. And Tramadol 50 mg.  Do not take Motrin or ASA.  Patient should try to lay down not sit as much as possible.  She should try this for 2 weeks.  If there is no improvement then she can try PT with Jeanene Erb.

## 2013-12-06 NOTE — Telephone Encounter (Signed)
Having back pain for 3 weeks. Treated with ibuprofen. Then about a week and a halg ago she began to have nerve pain and now has hand and finger numbness. Does not want a pain med or muscle relaxer if possible. Wants to know if a steroid DP might help. Started Naproxen on Saturday with no relief. You can leave a detailed message on her cell if you do not reach her.

## 2014-01-04 ENCOUNTER — Telehealth: Payer: Self-pay | Admitting: Family Medicine

## 2014-01-04 NOTE — Telephone Encounter (Signed)
Pt called to say she need an rx Whooping cough booster  Pharmacy ; [facility] [address]

## 2014-01-07 NOTE — Telephone Encounter (Signed)
Per Dr Tawanna Coolerodd pt can come here for injection.  Please schedule tdap appt

## 2014-01-08 NOTE — Telephone Encounter (Signed)
lmovm for pt to call and schedule a whooping cough booster

## 2014-01-11 ENCOUNTER — Other Ambulatory Visit: Payer: Self-pay

## 2014-02-04 ENCOUNTER — Telehealth: Payer: Self-pay | Admitting: Family Medicine

## 2014-02-04 DIAGNOSIS — F988 Other specified behavioral and emotional disorders with onset usually occurring in childhood and adolescence: Secondary | ICD-10-CM

## 2014-02-04 MED ORDER — AMPHETAMINE-DEXTROAMPHET ER 20 MG PO CP24: 20.0000 mg | ORAL_CAPSULE | Freq: Every day | ORAL | Status: DC

## 2014-02-04 MED ORDER — AMPHETAMINE-DEXTROAMPHET ER 20 MG PO CP24: 20.0000 mg | ORAL_CAPSULE | ORAL | Status: DC

## 2014-02-04 NOTE — Telephone Encounter (Signed)
Pt needs new generic adderall xr 20 mg °

## 2014-02-05 NOTE — Telephone Encounter (Signed)
Rx ready for pick up and Left message on machine for patient   

## 2014-03-11 ENCOUNTER — Encounter: Payer: BC Managed Care – PPO | Admitting: Family Medicine

## 2014-04-30 ENCOUNTER — Telehealth: Payer: Self-pay | Admitting: Family Medicine

## 2014-04-30 DIAGNOSIS — Z0000 Encounter for general adult medical examination without abnormal findings: Secondary | ICD-10-CM

## 2014-04-30 NOTE — Telephone Encounter (Signed)
[facility] wants to go [facility] to have CP[facility] labs drawn.

## 2014-05-01 ENCOUNTER — Encounter: Payer: BC Managed Care – PPO | Admitting: Family Medicine

## 2014-05-01 NOTE — Telephone Encounter (Signed)
Rx ready for pick up. 

## 2014-05-11 ENCOUNTER — Other Ambulatory Visit: Payer: Self-pay | Admitting: Family Medicine

## 2014-05-14 ENCOUNTER — Telehealth: Payer: Self-pay | Admitting: Family Medicine

## 2014-05-14 DIAGNOSIS — F988 Other specified behavioral and emotional disorders with onset usually occurring in childhood and adolescence: Secondary | ICD-10-CM

## 2014-05-14 MED ORDER — AMPHETAMINE-DEXTROAMPHET ER 20 MG PO CP24: 20.0000 mg | ORAL_CAPSULE | ORAL | Status: DC

## 2014-05-14 NOTE — Telephone Encounter (Signed)
Patient need re-fill on amphetamine-dextroamphetamine (ADDERALL XR) 20 MG 24 hr capsule.  She will be out tomorrow.

## 2014-05-14 NOTE — Telephone Encounter (Signed)
Left detailed message Rx's are ready for pickup, Rx's printed and signed by Dr.K

## 2014-07-05 ENCOUNTER — Other Ambulatory Visit: Payer: Self-pay | Admitting: Family Medicine

## 2014-07-16 ENCOUNTER — Other Ambulatory Visit (INDEPENDENT_AMBULATORY_CARE_PROVIDER_SITE_OTHER): Payer: BLUE CROSS/BLUE SHIELD

## 2014-07-16 DIAGNOSIS — Z0000 Encounter for general adult medical examination without abnormal findings: Secondary | ICD-10-CM

## 2014-07-16 LAB — LIPID PANEL
CHOLESTEROL: 200 mg/dL (ref 0–200)
HDL: 43.2 mg/dL (ref >39.00)
LDL CALC: 136 mg/dL — AB (ref 0–99)
NonHDL: 156.8
Total CHOL/HDL Ratio: 5
Triglycerides: 106 mg/dL (ref 0.0–149.0)
VLDL: 21.2 mg/dL (ref 0.0–40.0)

## 2014-07-16 LAB — URINALYSIS
BILIRUBIN URINE: NEGATIVE
Hgb urine dipstick: NEGATIVE
KETONES UR: NEGATIVE
Leukocytes, UA: NEGATIVE
Nitrite: NEGATIVE
PH: 6.5 (ref 5.0–8.0)
Specific Gravity, Urine: 1.015 (ref 1.000–1.030)
TOTAL PROTEIN, URINE-UPE24: NEGATIVE
Urine Glucose: NEGATIVE
Urobilinogen, UA: 1 (ref 0.0–1.0)

## 2014-07-16 LAB — CBC WITH DIFFERENTIAL/PLATELET
Basophils Absolute: 0 10*3/uL (ref 0.0–0.1)
Basophils Relative: 0.5 % (ref 0.0–3.0)
EOS PCT: 0.9 % (ref 0.0–5.0)
Eosinophils Absolute: 0.1 10*3/uL (ref 0.0–0.7)
HEMATOCRIT: 46.1 % — AB (ref 36.0–46.0)
Hemoglobin: 15.8 g/dL — ABNORMAL HIGH (ref 12.0–15.0)
LYMPHS ABS: 2.3 10*3/uL (ref 0.7–4.0)
Lymphocytes Relative: 41.9 % (ref 12.0–46.0)
MCHC: 34.3 g/dL (ref 30.0–36.0)
MCV: 93.4 fl (ref 78.0–100.0)
MONO ABS: 0.6 10*3/uL (ref 0.1–1.0)
MONOS PCT: 11.8 % (ref 3.0–12.0)
NEUTROS ABS: 2.5 10*3/uL (ref 1.4–7.7)
Neutrophils Relative %: 44.9 % (ref 43.0–77.0)
Platelets: 184 10*3/uL (ref 150.0–400.0)
RBC: 4.94 Mil/uL (ref 3.87–5.11)
RDW: 12.4 % (ref 11.5–15.5)
WBC: 5.5 10*3/uL (ref 4.0–10.5)

## 2014-07-16 LAB — BASIC METABOLIC PANEL
BUN: 16 mg/dL (ref 6–23)
CO2: 26 mEq/L (ref 19–32)
Calcium: 9.9 mg/dL (ref 8.4–10.5)
Chloride: 104 mEq/L (ref 96–112)
Creatinine, Ser: 0.85 mg/dL (ref 0.40–1.20)
GFR: 80.93 mL/min (ref >60.00)
GLUCOSE: 91 mg/dL (ref 70–99)
POTASSIUM: 3.8 meq/L (ref 3.5–5.1)
Sodium: 137 mEq/L (ref 135–145)

## 2014-07-16 LAB — TSH: TSH: 1.5 u[IU]/mL (ref 0.35–4.50)

## 2014-07-16 LAB — HEPATIC FUNCTION PANEL
ALBUMIN: 4.5 g/dL (ref 3.5–5.2)
ALT: 16 U/L (ref 0–35)
AST: 16 U/L (ref 0–37)
Alkaline Phosphatase: 32 U/L — ABNORMAL LOW (ref 39–117)
Bilirubin, Direct: 0.1 mg/dL (ref 0.0–0.3)
TOTAL PROTEIN: 6.8 g/dL (ref 6.0–8.3)
Total Bilirubin: 0.5 mg/dL (ref 0.2–1.2)

## 2014-07-22 ENCOUNTER — Ambulatory Visit (INDEPENDENT_AMBULATORY_CARE_PROVIDER_SITE_OTHER): Payer: BLUE CROSS/BLUE SHIELD | Admitting: Family Medicine

## 2014-07-22 ENCOUNTER — Encounter: Payer: Self-pay | Admitting: Family Medicine

## 2014-07-22 VITALS — HR 87 | Temp 98.6°F | Ht 64.0 in | Wt 207.7 lb

## 2014-07-22 DIAGNOSIS — F909 Attention-deficit hyperactivity disorder, unspecified type: Secondary | ICD-10-CM

## 2014-07-22 DIAGNOSIS — Z0000 Encounter for general adult medical examination without abnormal findings: Secondary | ICD-10-CM | POA: Insufficient documentation

## 2014-07-22 DIAGNOSIS — K120 Recurrent oral aphthae: Secondary | ICD-10-CM | POA: Diagnosis not present

## 2014-07-22 DIAGNOSIS — K219 Gastro-esophageal reflux disease without esophagitis: Secondary | ICD-10-CM

## 2014-07-22 DIAGNOSIS — F988 Other specified behavioral and emotional disorders with onset usually occurring in childhood and adolescence: Secondary | ICD-10-CM

## 2014-07-22 MED ORDER — AMPHETAMINE-DEXTROAMPHET ER 20 MG PO CP24: 20.0000 mg | ORAL_CAPSULE | Freq: Every day | ORAL | Status: DC

## 2014-07-22 MED ORDER — AMPHETAMINE-DEXTROAMPHET ER 20 MG PO CP24: 20.0000 mg | ORAL_CAPSULE | ORAL | Status: DC

## 2014-07-22 MED ORDER — OMEPRAZOLE 20 MG PO CPDR: DELAYED_RELEASE_CAPSULE | ORAL | Status: DC

## 2014-07-22 MED ORDER — HYDROCHLOROTHIAZIDE 25 MG PO TABS: ORAL_TABLET | ORAL | Status: DC

## 2014-07-22 MED ORDER — ACYCLOVIR 200 MG PO CAPS: ORAL_CAPSULE | ORAL | Status: DC

## 2014-07-22 NOTE — Progress Notes (Signed)
Pre visit review using our clinic review tool, if applicable. No additional management support is needed unless otherwise documented below in the visit note. 

## 2014-07-22 NOTE — Progress Notes (Signed)
   Subjective:    Patient ID: Alyssa [name], female    DOB: [birth year], 34 y.o.   MRN: [id number]  HPI Alyssa [name] is a 34 year old female nonsmoker who comes in today for general physical examination  She takes Adderall 20 mg long-acting for adult ADD and is functioning very well at work and at home  She also takes Zovirax when necessary for HSV one and Prilosec 20 mg twice a day because of reflux esophagitis  She gets her pelvics and Paps a GYN. BCPs of been discontinued she has a Mirena IUD  Tetanus booster 2015   Review of Systems Review of systems otherwise negative    Objective:   Physical Exam Well-developed well-nourished female no acute distress vital signs stable she's afebrile HEENT were negative neck was supple no adenopathy thyroid normal cardiopulmonary normal abdominal exam normal extremities skin normal extremities normal peripheral pulses normal       Assessment & Plan:  Healthy female  Adult ADD....... continue Adderall  Recurrent HSV one........Marland Kitchen. Zovirax when necessary  Reflux esophagitis......... Prilosec 20 mg twice a day.

## 2014-07-22 NOTE — Patient Instructions (Signed)
Continue current medications  Follow-up in one year sooner if any problems  Call 2 weeks from being out of your third prescriptions for refills  Rachel's extension is [phone]

## 2014-08-14 ENCOUNTER — Ambulatory Visit (INDEPENDENT_AMBULATORY_CARE_PROVIDER_SITE_OTHER): Payer: BLUE CROSS/BLUE SHIELD | Admitting: Family Medicine

## 2014-08-14 ENCOUNTER — Encounter: Payer: Self-pay | Admitting: Family Medicine

## 2014-08-14 VITALS — BP 126/84 | HR 80 | Temp 98.0°F | Ht 64.0 in | Wt 207.0 lb

## 2014-08-14 DIAGNOSIS — J0100 Acute maxillary sinusitis, unspecified: Secondary | ICD-10-CM

## 2014-08-14 MED ORDER — AZITHROMYCIN 250 MG PO TABS: ORAL_TABLET | ORAL | Status: DC

## 2014-08-14 NOTE — Progress Notes (Signed)
   Subjective:    Patient ID: Dicie BeamRebecca Hartness, female    DOB: 1980-05-21, 34 y.o.   MRN: [id number]  HPI Here for one week of sinus pressure, PND, HA, and coughing up green sputum. She had a fever of 99.9 degrees this am. On Tylenol.    Review of Systems  Constitutional: Positive for fever.  HENT: Positive for congestion, postnasal drip and sinus pressure.   Eyes: Negative.   Respiratory: Positive for cough.        Objective:   Physical Exam  Constitutional: She appears well-developed and well-nourished.  HENT:  Right Ear: External ear normal.  Left Ear: External ear normal.  Nose: Nose normal.  Mouth/Throat: Oropharynx is clear and moist.  Eyes: Conjunctivae are normal.  Pulmonary/Chest: Effort normal and breath sounds normal.  Lymphadenopathy:    She has no cervical adenopathy.          Assessment & Plan:  Sinusitis. Add Mucinex D

## 2014-08-14 NOTE — Progress Notes (Signed)
Pre visit review using our clinic review tool, if applicable. No additional management support is needed unless otherwise documented below in the visit note. 

## 2014-10-25 ENCOUNTER — Telehealth: Payer: Self-pay | Admitting: Family Medicine

## 2014-10-25 NOTE — Telephone Encounter (Signed)
Pt states she does not feel the amphetamine-dextroamphetamine (ADDERALL XR) 20 MG 24 hr capsule is working As well as when she fist took.  Pt would like to know if she can increase to a highter dose.  Ok to leave VM.  Pt aware rachel is out today. mon ok  Pt is on her last rx, (filled 7/28) so will need new rx 8/28.

## 2014-10-30 NOTE — Telephone Encounter (Signed)
Okay per Dr Tawanna Cooler.  Attempted to call patient, but unable to reach because voicemail is not set up yet.  Dr Tawanna Cooler will be in the office Monday.  Would she like her new script then?

## 2014-11-19 MED ORDER — AMPHETAMINE-DEXTROAMPHET ER 30 MG PO CP24: 30.0000 mg | ORAL_CAPSULE | Freq: Every day | ORAL | Status: DC

## 2014-11-19 NOTE — Telephone Encounter (Signed)
rx ready for pick up.  Attempted to call patient but unable to leave message due to voicemail not being set up yet.

## 2014-11-20 ENCOUNTER — Telehealth: Payer: Self-pay | Admitting: *Deleted

## 2014-11-20 NOTE — Telephone Encounter (Signed)
-----   Message from Newell Coral sent at 11/20/2014 12:37 PM EDT ----- Regarding: (so sorry this is a Pharmacist, community)  DOB - 1981/03/09   The pt called and is hoping to get a refill of her adderall rx printed.   Pt callback - [phone]

## 2014-11-20 NOTE — Telephone Encounter (Signed)
Rx ready for pick up and patient is aware 

## 2015-02-12 ENCOUNTER — Telehealth: Payer: Self-pay | Admitting: Family Medicine

## 2015-02-12 NOTE — Telephone Encounter (Signed)
Pt request refill of the following: amphetamine-dextroamphetamine (ADDERALL XR) 30 MG 24 hr capsule  Fleet ContrasRachel she said you can leave a voice mail.Marland Kitchen.   Phamacy:

## 2015-02-17 MED ORDER — AMPHETAMINE-DEXTROAMPHET ER 30 MG PO CP24: 30.0000 mg | ORAL_CAPSULE | Freq: Every day | ORAL | Status: DC

## 2015-02-17 NOTE — Telephone Encounter (Signed)
Rx ready for pick up and Left message on machine for patient   

## 2015-04-29 ENCOUNTER — Telehealth: Payer: Self-pay | Admitting: Family Medicine

## 2015-04-29 NOTE — Telephone Encounter (Signed)
Patient would like to have her ADDERALL medication refilled.

## 2015-04-29 NOTE — Telephone Encounter (Signed)
Okay to fill 05/20/15

## 2015-05-16 NOTE — Telephone Encounter (Signed)
Okay to pick up Monday

## 2015-05-16 NOTE — Telephone Encounter (Signed)
Pt called back because she has not heard anything about her refill. Pt will need to pick up on Monday 2/20. Is that ok?

## 2015-05-19 MED ORDER — AMPHETAMINE-DEXTROAMPHET ER 30 MG PO CP24: 30.0000 mg | ORAL_CAPSULE | Freq: Every day | ORAL | Status: DC

## 2015-05-19 NOTE — Telephone Encounter (Signed)
Rx ready for pick up and Left message on machine for patient to return our call.

## 2015-08-01 ENCOUNTER — Telehealth: Payer: Self-pay | Admitting: Family Medicine

## 2015-08-01 NOTE — Telephone Encounter (Signed)
Pt request refill amphetamine-dextroamphetamine (ADDERALL XR) 30 MG 24 hr capsule °3 mo supply °

## 2015-08-04 NOTE — Telephone Encounter (Signed)
Okay to fill 08/16/15

## 2015-08-11 ENCOUNTER — Other Ambulatory Visit: Payer: Self-pay | Admitting: Family Medicine

## 2015-08-12 ENCOUNTER — Other Ambulatory Visit: Payer: Self-pay | Admitting: General Practice

## 2015-08-13 MED ORDER — AMPHETAMINE-DEXTROAMPHET ER 30 MG PO CP24: 30.0000 mg | ORAL_CAPSULE | Freq: Every day | ORAL | Status: DC

## 2015-08-13 NOTE — Telephone Encounter (Signed)
Pt notified Rx ready for pickup. Rx printed and signed.  

## 2015-08-15 ENCOUNTER — Telehealth: Payer: Self-pay | Admitting: Family Medicine

## 2015-08-15 NOTE — Telephone Encounter (Signed)
Pt said [facility] mail order  never received rxs for omeprazole nor acyclovir 200 mg for 90 day supply w/refills. Please call [phone] reference # [phone]

## 2015-08-15 NOTE — Telephone Encounter (Signed)
Verified with [facility] that medications were mailed and should arrive on 5/20.  Pt notified.

## 2015-09-10 ENCOUNTER — Telehealth: Payer: Self-pay

## 2015-09-10 NOTE — Telephone Encounter (Signed)
Spoke with Dr. Tawanna Coolerodd and he has approved refill. Please call pharmacy and refill.

## 2015-09-10 NOTE — Telephone Encounter (Signed)
Called to speak with pharmacy & verbal order given that it was ok to refill Rx - patient notified.

## 2015-09-10 NOTE — Telephone Encounter (Signed)
[facility] Night - Client TELEPHONE ADVICE RECORD [facility] Patient Name: Alyssa [name] Gender: Female DOB: 13-Apr-1980 Age: [age] Y 3227 D Return Phone Number: [phone] (Primary) Address: City/State/Zip: [place] Client [facility] Night - Client Client Site  [facility] - Night Physician Alonza Smokerodd, Jeff - MD Contact Type Call Who Is Calling Patient / Member / Family / Caregiver Call Type Triage / Clinical Relationship To Patient Self Return Phone Number [phone] (Primary) Chief Complaint Prescription Refill or Medication Request (non symptomatic) Reason for Call Symptomatic / Request for Health Information Initial Comment Caller states leaving for [place] on Sat, needing her Adderol refilled before going. She is needing ok from dr to refill early. Pharm info: [facility] [place], [phone]. Rx is already there, just need ok from dr, they don't have to call in anything Translation No Nurse Assessment Guidelines Guideline Title Affirmed Question Affirmed Notes Nurse Date/Time (Eastern Time) Disp. Time Lamount Cohen(Eastern Time) Disposition Final User 09/09/2015 5:13:25 PM Attempt made - message left Cherre RobinsGibbs, RN, Sarah 09/09/2015 5:32:37 PM FINAL ATTEMPT MADE - message left Yes Noelle PennerGibbs, RN, Maralyn SagoSarah

## 2015-10-03 ENCOUNTER — Other Ambulatory Visit: Payer: Self-pay | Admitting: Family Medicine

## 2015-11-04 ENCOUNTER — Telehealth: Payer: Self-pay | Admitting: Family Medicine

## 2015-11-04 NOTE — Telephone Encounter (Signed)
Pt request refill  amphetamine-dextroamphetamine (ADDERALL XR) 30 MG 24 hr capsule  Pt has follow up on meds appointment 9/18.

## 2015-11-05 ENCOUNTER — Other Ambulatory Visit: Payer: Self-pay | Admitting: Emergency Medicine

## 2015-11-05 MED ORDER — AMPHETAMINE-DEXTROAMPHET ER 30 MG PO CP24: 30.0000 mg | ORAL_CAPSULE | Freq: Every day | ORAL | 0 refills | Status: DC

## 2015-11-05 NOTE — Telephone Encounter (Signed)
Call and left message that prescriptions are up front ready for pick up

## 2015-11-06 ENCOUNTER — Other Ambulatory Visit: Payer: Self-pay | Admitting: Family Medicine

## 2015-12-15 ENCOUNTER — Ambulatory Visit: Payer: BLUE CROSS/BLUE SHIELD | Admitting: Family Medicine

## 2016-02-13 ENCOUNTER — Other Ambulatory Visit: Payer: Self-pay | Admitting: Family Medicine

## 2016-02-13 MED ORDER — OMEPRAZOLE 20 MG PO CPDR: DELAYED_RELEASE_CAPSULE | ORAL | 0 refills | Status: DC

## 2016-02-13 NOTE — Telephone Encounter (Signed)
Pt would like to speak with you [place] on a person matter you can call pt on cell if you don't get her on the cell call her work number and use Options 1 and ask for Devon EnergyBecky.

## 2016-02-13 NOTE — Telephone Encounter (Signed)
Pt has appt on 11/22 BUT Needs a 30 day omeprazole (PRILOSEC) 20 MG capsule   because she is out  [facility] [address] [phone]

## 2016-02-13 NOTE — Telephone Encounter (Signed)
Spoke to pt, told her Rx sent to pharmacy as requested. Pt verbalized understanding.

## 2016-02-13 NOTE — Telephone Encounter (Signed)
Called and left voicemail for pt to return call to office.  

## 2016-02-18 ENCOUNTER — Encounter: Payer: Self-pay | Admitting: Family Medicine

## 2016-02-18 ENCOUNTER — Ambulatory Visit (INDEPENDENT_AMBULATORY_CARE_PROVIDER_SITE_OTHER): Payer: BLUE CROSS/BLUE SHIELD | Admitting: Family Medicine

## 2016-02-18 VITALS — BP 130/92 | HR 96 | Temp 98.6°F | Ht 64.0 in | Wt 221.4 lb

## 2016-02-18 DIAGNOSIS — K120 Recurrent oral aphthae: Secondary | ICD-10-CM

## 2016-02-18 DIAGNOSIS — K219 Gastro-esophageal reflux disease without esophagitis: Secondary | ICD-10-CM

## 2016-02-18 DIAGNOSIS — F902 Attention-deficit hyperactivity disorder, combined type: Secondary | ICD-10-CM | POA: Diagnosis not present

## 2016-02-18 DIAGNOSIS — Z803 Family history of malignant neoplasm of breast: Secondary | ICD-10-CM | POA: Diagnosis not present

## 2016-02-18 MED ORDER — OMEPRAZOLE 20 MG PO CPDR: DELAYED_RELEASE_CAPSULE | ORAL | 10 refills | Status: DC

## 2016-02-18 MED ORDER — HYDROCHLOROTHIAZIDE 25 MG PO TABS
ORAL_TABLET | ORAL | 3 refills | Status: DC
Start: 2016-02-18 — End: 2016-04-01

## 2016-02-18 MED ORDER — AMPHETAMINE-DEXTROAMPHET ER 30 MG PO CP24: 30.0000 mg | ORAL_CAPSULE | Freq: Every day | ORAL | 0 refills | Status: DC

## 2016-02-18 MED ORDER — ACYCLOVIR 200 MG PO CAPS
ORAL_CAPSULE | ORAL | 4 refills | Status: DC
Start: 2016-02-18 — End: 2016-02-25

## 2016-02-18 NOTE — Progress Notes (Signed)
Alyssa [name] is a 35 year old recently married female who comes in today for follow-up of adult ADD  She takes Adderall 30 mg daily because of adult ADD. She wishes to continue that dose. 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  She also takes acyclovir when necessary, hydrochlorothiazide 25 mg daily when necessary and Prilosec 20 mg when necessary  She just got married. Her last menstrual period was November 7 of the 12th. She's not using birth control. She understands she can take Adderall if there is any question about her being pregnant.  She gets routine eye care, dental care, mammography would start now because a family history of breast cancer. Her mother and maternal great-grandmother both had breast cancer. Alyssa [name] had the genetic studies which were negative. However I would suggest she get a yearly mammogram.  Weight was 207 in May 2016 now it's up to 221. Recommend she start a diet exercise and weight loss program  Mother is still alive at age 36 despite having breast cancer at age 541. Father has history of hypertension.  Physical evaluation weight 221 temp 98 pulse 60 and regular BP when she came in 146/108 repeat by me after she sat and rested 130/92.  Impression #1 adult ADD....... continue Adderall  #2 HSV...... acyclovir when necessary  #3 bloating with menses.... Refill hydrochlorothiazide  #4 reflux esophagitis....... continue Prilosec 20 mg twice a day  Obesity........... diet exercise and weight loss  #6 rule out hypertension......... BP check at home daily for 2 weeks.........Marland Kitchen. return if blood pressure not normal  Family history of breast cancer...Marland Kitchen.Marland Kitchen.Marland Kitchen. recommend annual mammography.

## 2016-02-18 NOTE — Patient Instructions (Signed)
Begin a diet and exercise program,,,,,,,,, carbohydrate free diet walk 30 minutes daily  Continue the Adderall 20 mg once daily  Acyclovir when necessary  Prilosec when necessary  Hydrochlorothiazide when necessary  Check your blood pressure daily in the morning for 2 weeks,,,,,,, blood pressure goal 135/85 or less,,,,,, if not at goal then returned with the data and the device for reevaluation. If your blood pressure is at goal in because of your family history I would check your blood pressure weekly at home  Omron pump up digital blood pressure cuff,,,,,,,,, [employer]  Return in one year sooner if any problems  I would call and get set up for your mammogram. Because of your family history I would have a mammogram yearly

## 2016-02-25 ENCOUNTER — Other Ambulatory Visit: Payer: Self-pay | Admitting: Emergency Medicine

## 2016-02-25 MED ORDER — OMEPRAZOLE 20 MG PO CPDR: DELAYED_RELEASE_CAPSULE | ORAL | 10 refills | Status: DC

## 2016-02-25 MED ORDER — ACYCLOVIR 200 MG PO CAPS: ORAL_CAPSULE | ORAL | 4 refills | Status: DC

## 2016-04-01 ENCOUNTER — Other Ambulatory Visit: Payer: Self-pay | Admitting: Emergency Medicine

## 2016-04-01 ENCOUNTER — Telehealth: Payer: Self-pay | Admitting: Family Medicine

## 2016-04-01 MED ORDER — HYDROCHLOROTHIAZIDE 25 MG PO TABS: ORAL_TABLET | ORAL | 3 refills | Status: AC

## 2016-04-01 NOTE — Telephone Encounter (Signed)
Left message for pt to give the office a call back in reference to a medication refill( Hydrodiuril). Pharmacy is requesting refill but the medication was refilled 02/18/2016. Pt should still have refills left

## 2016-04-01 NOTE — Telephone Encounter (Signed)
Left pt a detailed message about medication refill was sent to a local pharmacy. If she needs medication today it is ready to picked up but I am going to send her medication through mail order.

## 2016-04-01 NOTE — Telephone Encounter (Signed)
Sent refill through mail pharmacy.

## 2016-04-01 NOTE — Telephone Encounter (Signed)
Pt returned the call and state she is not sure why someone called her.  If you really need to speak with her please continue to call back or you can leave a detail msg on her voice msg b/c she is at work.  If the call is in reference to her Rx hydrochlorothiazide she needs #90 and sent to  Pharm:  [facility]

## 2016-04-02 ENCOUNTER — Other Ambulatory Visit: Payer: Self-pay | Admitting: Emergency Medicine

## 2016-05-13 ENCOUNTER — Telehealth: Payer: Self-pay | Admitting: Family Medicine

## 2016-05-13 NOTE — Telephone Encounter (Signed)
Pt needs new rx generic adderall xr 30 mg °

## 2016-05-17 ENCOUNTER — Telehealth: Payer: Self-pay | Admitting: Emergency Medicine

## 2016-05-17 MED ORDER — AMPHETAMINE-DEXTROAMPHET ER 30 MG PO CP24: 30.0000 mg | ORAL_CAPSULE | Freq: Every day | ORAL | 0 refills | Status: DC

## 2016-05-17 NOTE — Telephone Encounter (Signed)
Left voicemail for pt informing her that rx is ready for pickup

## 2016-08-01 ENCOUNTER — Encounter: Payer: Self-pay | Admitting: Family Medicine

## 2016-08-13 ENCOUNTER — Encounter: Payer: Self-pay | Admitting: Family Medicine

## 2016-08-16 MED ORDER — AMPHETAMINE-DEXTROAMPHET ER 30 MG PO CP24: 30.0000 mg | ORAL_CAPSULE | Freq: Every day | ORAL | 0 refills | Status: DC

## 2016-11-07 ENCOUNTER — Encounter: Payer: Self-pay | Admitting: Family Medicine

## 2016-11-14 ENCOUNTER — Encounter: Payer: Self-pay | Admitting: Family Medicine

## 2016-11-17 ENCOUNTER — Telehealth: Payer: Self-pay | Admitting: Family Medicine

## 2016-11-17 NOTE — Telephone Encounter (Signed)
Pt need new Rx for Addrell   Pt is aware of 3 business days for refills and someone will call when ready for pick up.  Pt state that she has sent two msgs a week and 1/2 ago and the other one Sunday.  Pt state that she need really needs to speak with someone.

## 2016-11-18 MED ORDER — AMPHETAMINE-DEXTROAMPHET ER 30 MG PO CP24: 30.0000 mg | ORAL_CAPSULE | Freq: Every day | ORAL | 0 refills | Status: DC

## 2016-11-18 NOTE — Telephone Encounter (Signed)
Spoke with pt voiced understanding that her Rx for Adderral will be at the front office ready for pick up.

## 2016-11-18 NOTE — Telephone Encounter (Signed)
Done for one month  ?

## 2016-11-18 NOTE — Telephone Encounter (Signed)
Called pt left a message in regards to her refill request for Adderral, asked pt to return my call in the office and schedule a CPE appointment with Dr Tawanna Cooler before further refills.

## 2016-11-18 NOTE — Telephone Encounter (Signed)
Patient is requesting for refills on her Adderral which was last filled on 08/16/2016. Pt last OV was 01/29/2016 and last Labs were 07/06/2014, Spoke to pt in scheduling an appointment to see dr Tawanna Cooler for an office visit.Pt states that she will be going out of town tomorrow and needs to have refill today.Please Advise. ( Dr Barbette Or pt).

## 2016-12-08 ENCOUNTER — Telehealth: Payer: Self-pay | Admitting: Family Medicine

## 2016-12-08 NOTE — Telephone Encounter (Signed)
Pt would like to see if Dr. Tawanna Coolerodd would send her in a antibiotic for a sinus infection that she has it is in her chest, sore throat and slight fever and is out of town in [place].  Pharm:  [facility] [address]  [place] [id number]  [phone]

## 2016-12-09 ENCOUNTER — Other Ambulatory Visit: Payer: Self-pay

## 2016-12-09 MED ORDER — ACYCLOVIR 200 MG PO CAPS: ORAL_CAPSULE | ORAL | 4 refills | Status: AC

## 2016-12-09 NOTE — Telephone Encounter (Signed)
Please Advise

## 2016-12-14 NOTE — Telephone Encounter (Signed)
We would be happy to see the patient here in the office for an evaluation. Alyssa [name] will: Medication without seeing the patient. Please advise patient if she feels like she needs to be seen in [place] to see somebody in that community

## 2016-12-14 NOTE — Telephone Encounter (Signed)
Pt states that she is now feeling better and does not need an Antibiotic as requested earlier.

## 2016-12-15 ENCOUNTER — Other Ambulatory Visit: Payer: Self-pay

## 2016-12-15 MED ORDER — AMPHETAMINE-DEXTROAMPHET ER 30 MG PO CP24: 30.0000 mg | ORAL_CAPSULE | Freq: Every day | ORAL | 0 refills | Status: DC

## 2016-12-15 NOTE — Telephone Encounter (Signed)
Rx has been signed, and ready for pick up on the front desk, pt is aware left a message on her voicemail

## 2016-12-17 ENCOUNTER — Encounter: Payer: Self-pay | Admitting: Family Medicine

## 2016-12-20 LAB — HM COLONOSCOPY

## 2016-12-28 ENCOUNTER — Encounter: Payer: Self-pay | Admitting: Family Medicine

## 2017-02-16 ENCOUNTER — Other Ambulatory Visit: Payer: Self-pay

## 2017-02-16 ENCOUNTER — Telehealth: Payer: Self-pay | Admitting: Family Medicine

## 2017-02-16 MED ORDER — OMEPRAZOLE 20 MG PO CPDR: DELAYED_RELEASE_CAPSULE | ORAL | 3 refills | Status: DC

## 2017-02-16 NOTE — Telephone Encounter (Signed)
Request for controlled substnce

## 2017-02-16 NOTE — Telephone Encounter (Signed)
Called pt left a message to return my call in the office in regards to her request for Rx Adderall

## 2017-02-16 NOTE — Telephone Encounter (Signed)
Copied from CRM [id number]. Topic: Quick Communication - See Telephone Encounter >> Feb 16, 2017 12:16 PM Percival SpanishKennedy, Cheryl W wrote: CRM for notification. See Telephone encounter for: 02/16/17.   Pt req refill amphetamine-dextroamphetamine (ADDERALL XR) 30 MG 24 hr capsule     [facility]

## 2017-02-21 ENCOUNTER — Telehealth: Payer: Self-pay | Admitting: Family Medicine

## 2017-02-21 ENCOUNTER — Other Ambulatory Visit: Payer: Self-pay

## 2017-02-21 MED ORDER — AMPHETAMINE-DEXTROAMPHET ER 30 MG PO CP24: 30.0000 mg | ORAL_CAPSULE | Freq: Every day | ORAL | 0 refills | Status: DC

## 2017-02-21 NOTE — Telephone Encounter (Signed)
Rx for Adderral has been signed and is ready for pick up at the front office,patient is aware.

## 2017-02-21 NOTE — Telephone Encounter (Signed)
Rx for Adderral has been signed and is ready for pick up at the front office.

## 2017-02-21 NOTE — Telephone Encounter (Signed)
Copied from CRM [phone]. Topic: General - Other >> Feb 21, 2017  9:37 AM Gerrianne ScalePayne, Ignacia Gentzler L wrote: Reason for CRM: patient calling Harriett SineNancy ,CMA back regarding her Adderall please call on work number and press option 1 and ask for Kriste BasqueBecky know one knows her by Lurena Joinerebecca

## 2017-03-07 ENCOUNTER — Encounter: Payer: BLUE CROSS/BLUE SHIELD | Admitting: Family Medicine

## 2017-04-13 ENCOUNTER — Other Ambulatory Visit: Payer: Self-pay | Admitting: Family Medicine

## 2017-05-02 ENCOUNTER — Encounter: Payer: BLUE CROSS/BLUE SHIELD | Admitting: Family Medicine

## 2017-05-25 ENCOUNTER — Encounter: Payer: Self-pay | Admitting: Family Medicine

## 2017-05-25 DIAGNOSIS — R600 Localized edema: Secondary | ICD-10-CM | POA: Diagnosis not present

## 2017-05-27 ENCOUNTER — Telehealth: Payer: Self-pay

## 2017-05-27 ENCOUNTER — Other Ambulatory Visit: Payer: Self-pay

## 2017-05-27 MED ORDER — AMPHETAMINE-DEXTROAMPHET ER 30 MG PO CP24: 30.0000 mg | ORAL_CAPSULE | Freq: Every day | ORAL | 0 refills | Status: AC

## 2017-05-27 NOTE — Telephone Encounter (Signed)
Pt Rx for Adderall has been printed,signed and pt is aware to pick it up at the office. Pt voiced understanding.

## 2017-06-13 DIAGNOSIS — F900 Attention-deficit hyperactivity disorder, predominantly inattentive type: Secondary | ICD-10-CM | POA: Diagnosis not present

## 2017-06-13 DIAGNOSIS — Z0000 Encounter for general adult medical examination without abnormal findings: Secondary | ICD-10-CM | POA: Diagnosis not present

## 2017-06-13 DIAGNOSIS — Z13228 Encounter for screening for other metabolic disorders: Secondary | ICD-10-CM | POA: Diagnosis not present

## 2017-06-23 ENCOUNTER — Telehealth: Payer: Self-pay | Admitting: *Deleted

## 2017-06-23 NOTE — Telephone Encounter (Signed)
Cory from [employer] needs to speak with someone concerning the PA for amphetamine-dextro ,  He states wrong questions populated.  His contact number is [phone]  Key [id number]

## 2017-06-23 NOTE — Telephone Encounter (Signed)
I left a message for Kandee KeenCory to return my call.

## 2017-06-23 NOTE — Telephone Encounter (Signed)
Prior auth for Amphetamine-dextro sent to [email or web address].

## 2017-06-24 NOTE — Telephone Encounter (Signed)
Sam with [employer] calling to speak with who handles the PA's at the office. Please call back at [phone] reference [id number]

## 2017-06-28 ENCOUNTER — Telehealth: Payer: Self-pay | Admitting: Family Medicine

## 2017-06-28 NOTE — Telephone Encounter (Signed)
Copied from CRM [id number]. Topic: Quick Communication - See Telephone Encounter >> Jun 28, 2017  2:02 PM Terisa Starraylor, Brittany L wrote: CRM for notification. See Telephone encounter for: 06/28/17.  [employer] needs some additional info for the PA on amphetamine-dextroamphetamine (ADDERALL XR) 30 MG 24 hr capsule Call back is [phone] ext [phone]. Please advise.

## 2017-07-01 NOTE — Telephone Encounter (Signed)
I left a message for Alyssa [name] to return my call. 

## 2017-07-01 NOTE — Telephone Encounter (Signed)
Sam with [employer] needing to know if the pt can try the brand name for Adderall instead of generic. If so PA is not needed. CB#: [phone]

## 2017-07-04 NOTE — Telephone Encounter (Signed)
Spoke with patient and she now has a new PCP.

## 2017-07-04 NOTE — Telephone Encounter (Signed)
Spoke with patient and she now has a new PCP. 

## 2017-07-21 ENCOUNTER — Other Ambulatory Visit: Payer: Self-pay | Admitting: Physician Assistant

## 2017-07-21 DIAGNOSIS — M5441 Lumbago with sciatica, right side: Secondary | ICD-10-CM

## 2017-07-21 DIAGNOSIS — Z77018 Contact with and (suspected) exposure to other hazardous metals: Secondary | ICD-10-CM

## 2017-07-25 ENCOUNTER — Ambulatory Visit
Admission: RE | Admit: 2017-07-25 | Discharge: 2017-07-25 | Disposition: A | Discharge status: Home / Self Care | Payer: BLUE CROSS/BLUE SHIELD | Source: Ambulatory Visit | Attending: Physician Assistant | Admitting: Physician Assistant

## 2017-07-25 DIAGNOSIS — S0085XA Superficial foreign body of other part of head, initial encounter: Secondary | ICD-10-CM | POA: Diagnosis not present

## 2017-07-25 DIAGNOSIS — Z77018 Contact with and (suspected) exposure to other hazardous metals: Secondary | ICD-10-CM

## 2017-07-26 ENCOUNTER — Ambulatory Visit
Admission: RE | Admit: 2017-07-26 | Discharge: 2017-07-26 | Disposition: A | Discharge status: Home / Self Care | Payer: BLUE CROSS/BLUE SHIELD | Source: Ambulatory Visit | Attending: Physician Assistant | Admitting: Physician Assistant

## 2017-07-26 DIAGNOSIS — M5441 Lumbago with sciatica, right side: Secondary | ICD-10-CM

## 2017-07-26 DIAGNOSIS — M5416 Radiculopathy, lumbar region: Secondary | ICD-10-CM | POA: Diagnosis not present

## 2017-07-26 IMAGING — MR MR LUMBAR SPINE W/O CM
4 of 5 series · 25 of 48 positions shown · non-contrast
Comparison: None.

CLINICAL DATA: Low back pain with right leg pain

EXAM:
MRI LUMBAR SPINE WITHOUT CONTRAST
TECHNIQUE: Multiplanar, multisequence MR imaging of the lumbar spine was
performed. No intravenous contrast was administered.

[Series 2: T2 · sagittal · 4.0mm · 0.44mm/px · 5 of 12 slices shown (1 of 2)]
[im 1/12]
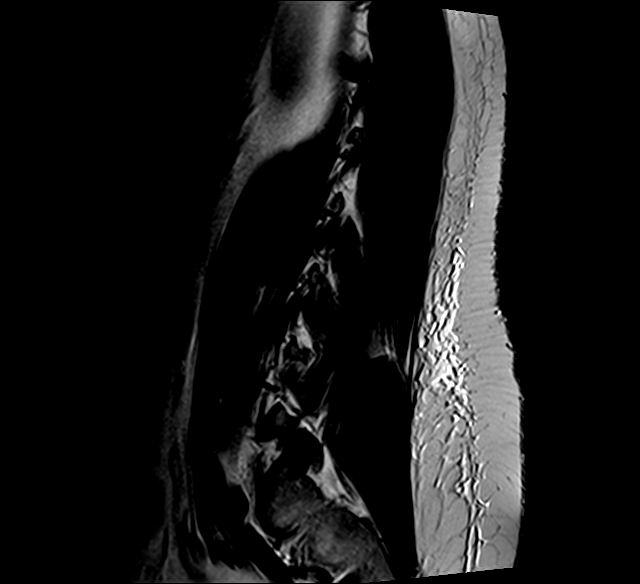
[im 3/12]
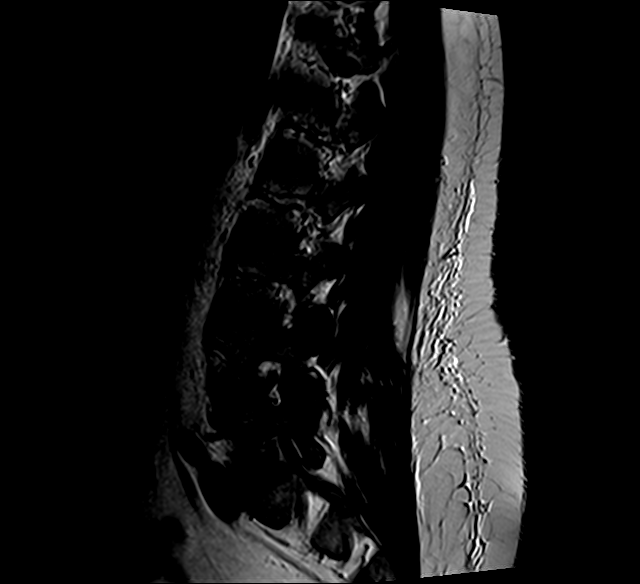
[im 6/12]
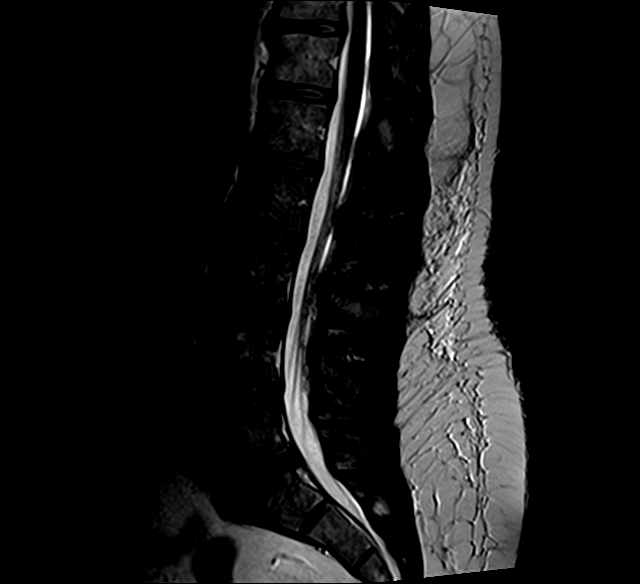
[im 9/12]
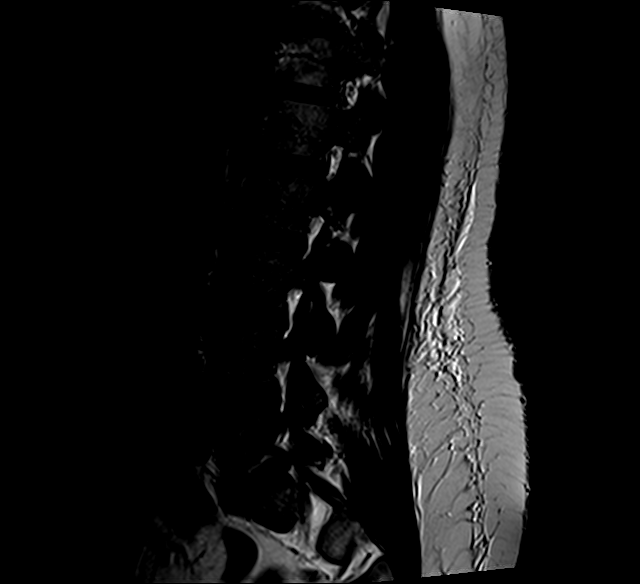
[im 12/12]
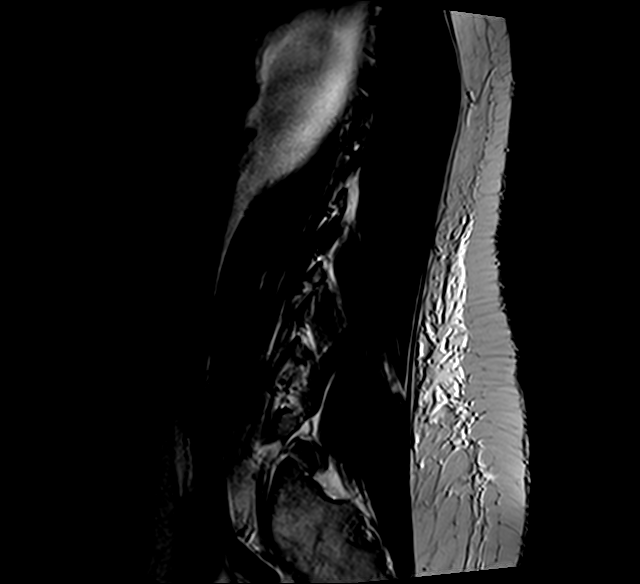

[Series 3: T1 · sagittal · 4.0mm · 0.55mm/px · 5 of 12 slices shown (1 of 2)]
[im 1/12]
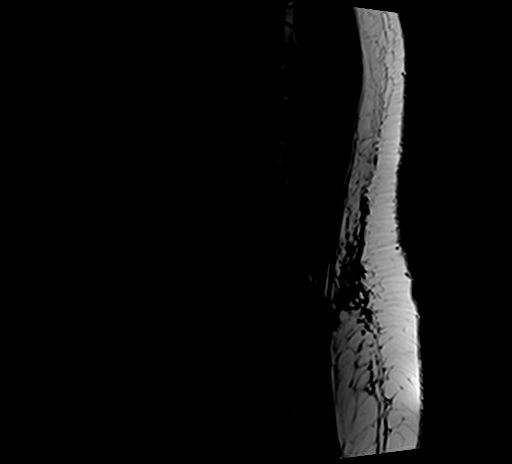
[im 3/12]
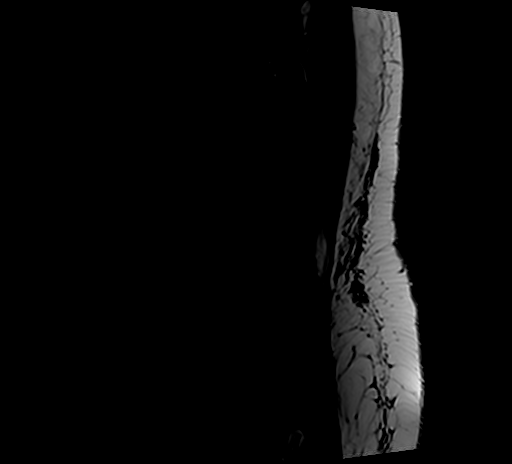
[im 6/12]
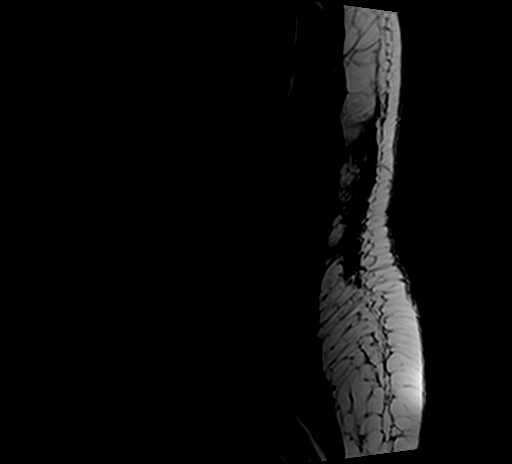
[im 9/12]
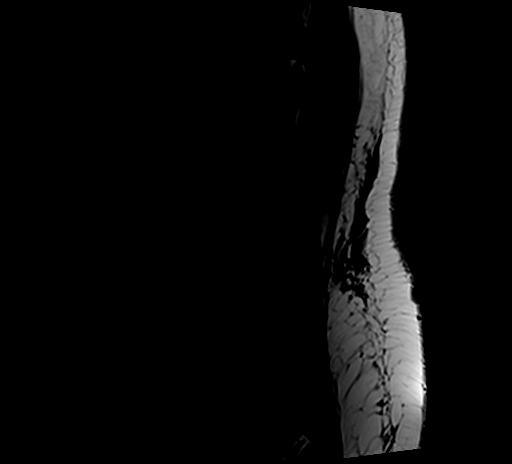
[im 12/12]
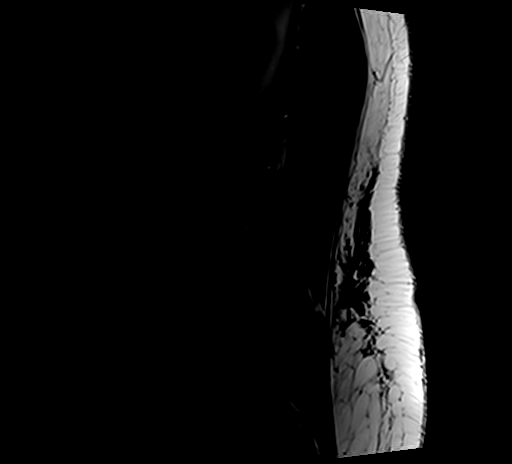

[Series 4: T1 · axial · 4.0mm · 0.37mm/px · z∈[-105,+50]mm · 5 of 33 slices shown (2 of 2)]
[im 3/33]
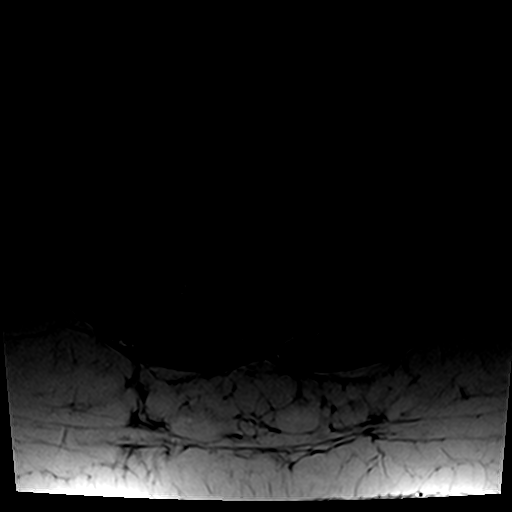
[im 5/33]
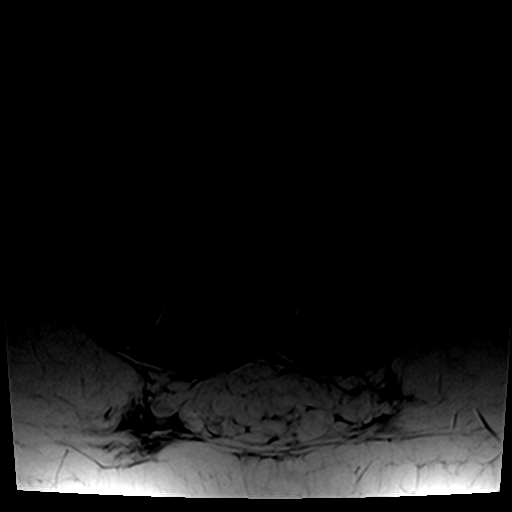
[im 7/33]
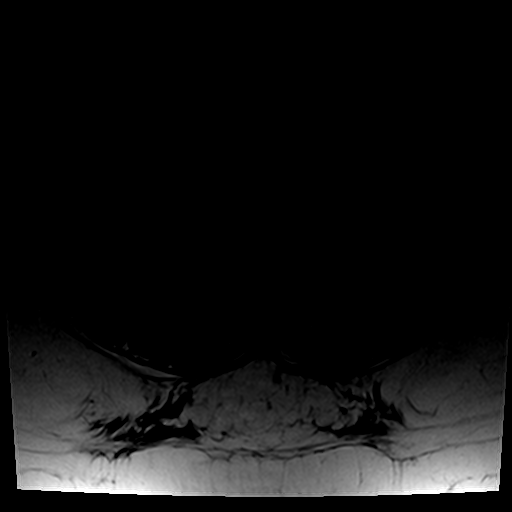
[im 18/33]
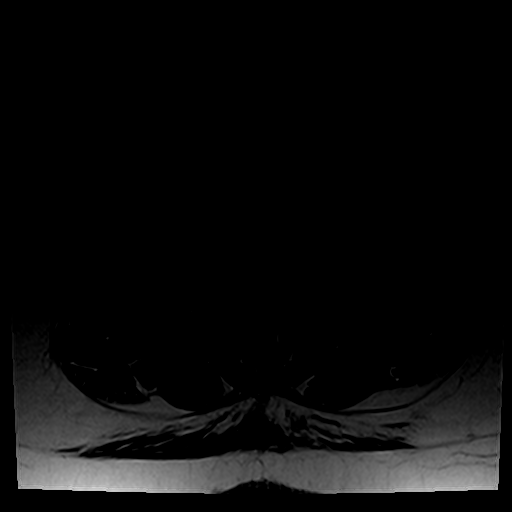
[im 28/33]
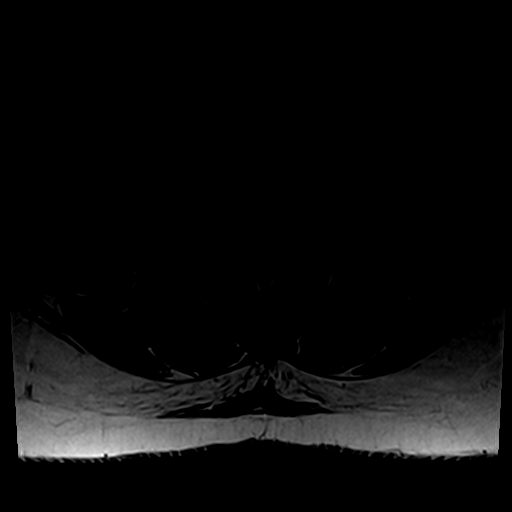

[Series 6: T2 · axial · 4.0mm · 0.74mm/px · z∈[-105,+112]mm · 10 of 33 slices shown (2 of 2)]
[im 3/33]
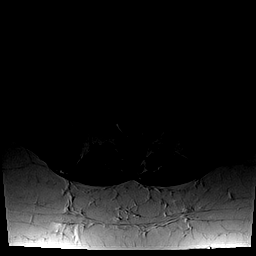
[im 5/33]
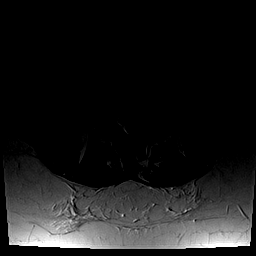
[im 7/33]
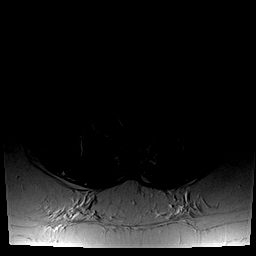
[im 11/33]
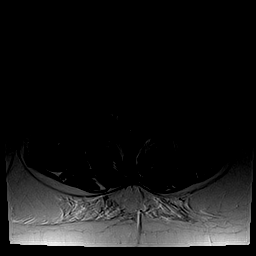
[im 15/33]
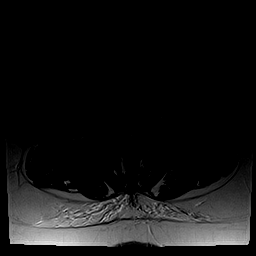
[im 18/33]
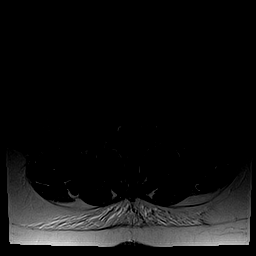
[im 20/33]
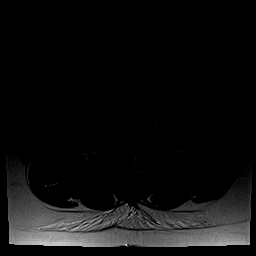
[im 24/33]
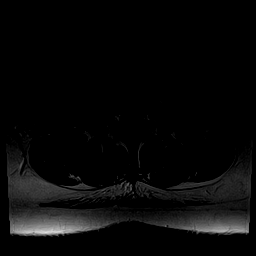
[im 28/33]
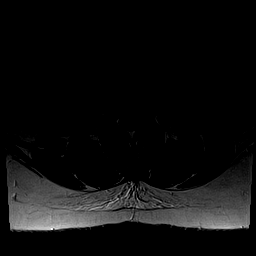
[im 33/33]
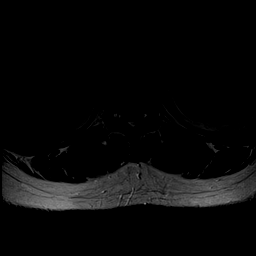

[25 of 48 positions shown; findings below may reference images not displayed]

FINDINGS: Segmentation:  Normal

Alignment:  Normal

Vertebrae:  Normal

Conus medullaris and cauda equina: Conus extends to the L1-2 level.
Conus and cauda equina appear normal.

Paraspinal and other soft tissues: Negative for paraspinous mass or
adenopathy

Disc levels:

L1-2: Negative

L2-3: Negative

L3-4: Mild facet degeneration bilaterally.  No significant stenosis.

L4-5: Mild facet hypertrophy bilaterally without significant
stenosis

L5-S1: Extruded disc fragment on the right with downgoing disc
material. Moderately large disc fragment is present with impingement
of the right S1 nerve root.
IMPRESSION: Moderately large extruded disc fragment on the right L5-S1 with disc
fragment behind the S1 vertebral body. Impingement of the right S1
nerve root.

## 2017-07-27 DIAGNOSIS — I10 Essential (primary) hypertension: Secondary | ICD-10-CM | POA: Diagnosis not present

## 2017-07-27 DIAGNOSIS — M5441 Lumbago with sciatica, right side: Secondary | ICD-10-CM | POA: Diagnosis not present

## 2017-07-27 DIAGNOSIS — Z6841 Body Mass Index (BMI) 40.0 and over, adult: Secondary | ICD-10-CM | POA: Diagnosis not present

## 2017-08-25 DIAGNOSIS — M5127 Other intervertebral disc displacement, lumbosacral region: Secondary | ICD-10-CM | POA: Diagnosis not present

## 2017-08-25 DIAGNOSIS — M4807 Spinal stenosis, lumbosacral region: Secondary | ICD-10-CM | POA: Diagnosis not present

## 2017-09-23 ENCOUNTER — Ambulatory Visit (HOSPITAL_COMMUNITY)
Admission: RE | Admit: 2017-09-23 | Discharge: 2017-09-23 | Disposition: A | Discharge status: Home / Self Care | Payer: BLUE CROSS/BLUE SHIELD | Source: Ambulatory Visit | Attending: Vascular Surgery | Admitting: Vascular Surgery

## 2017-09-23 ENCOUNTER — Other Ambulatory Visit: Payer: Self-pay

## 2017-09-23 DIAGNOSIS — M7989 Other specified soft tissue disorders: Secondary | ICD-10-CM | POA: Insufficient documentation

## 2017-09-23 DIAGNOSIS — I82402 Acute embolism and thrombosis of unspecified deep veins of left lower extremity: Secondary | ICD-10-CM

## 2017-09-23 DIAGNOSIS — M79605 Pain in left leg: Secondary | ICD-10-CM | POA: Diagnosis not present

## 2017-09-24 DIAGNOSIS — L03119 Cellulitis of unspecified part of limb: Secondary | ICD-10-CM | POA: Diagnosis not present

## 2017-09-28 DIAGNOSIS — M5441 Lumbago with sciatica, right side: Secondary | ICD-10-CM | POA: Diagnosis not present

## 2017-10-11 ENCOUNTER — Other Ambulatory Visit: Payer: Self-pay | Admitting: Neurosurgery

## 2017-10-11 DIAGNOSIS — M5441 Lumbago with sciatica, right side: Secondary | ICD-10-CM

## 2017-10-22 ENCOUNTER — Ambulatory Visit
Admission: RE | Admit: 2017-10-22 | Discharge: 2017-10-22 | Disposition: A | Discharge status: Home / Self Care | Payer: BLUE CROSS/BLUE SHIELD | Source: Ambulatory Visit | Attending: Neurosurgery | Admitting: Neurosurgery

## 2017-10-22 DIAGNOSIS — M545 Low back pain: Secondary | ICD-10-CM | POA: Diagnosis not present

## 2017-10-22 DIAGNOSIS — M5441 Lumbago with sciatica, right side: Secondary | ICD-10-CM

## 2017-10-22 MED ORDER — GADOBENATE DIMEGLUMINE 529 MG/ML IV SOLN
20.0000 mL | Freq: Once | INTRAVENOUS | Status: AC | PRN
  Administered 2017-10-22: 20 mL via INTRAVENOUS

## 2017-12-08 ENCOUNTER — Other Ambulatory Visit: Payer: Self-pay | Admitting: Physician Assistant

## 2017-12-08 DIAGNOSIS — M5441 Lumbago with sciatica, right side: Secondary | ICD-10-CM

## 2017-12-23 ENCOUNTER — Ambulatory Visit
Admission: RE | Admit: 2017-12-23 | Discharge: 2017-12-23 | Disposition: A | Discharge status: Home / Self Care | Payer: BLUE CROSS/BLUE SHIELD | Source: Ambulatory Visit | Attending: Physician Assistant | Admitting: Physician Assistant

## 2017-12-23 ENCOUNTER — Other Ambulatory Visit: Payer: Self-pay | Admitting: Physician Assistant

## 2017-12-23 DIAGNOSIS — M5441 Lumbago with sciatica, right side: Secondary | ICD-10-CM

## 2017-12-23 DIAGNOSIS — M545 Low back pain: Secondary | ICD-10-CM | POA: Diagnosis not present

## 2017-12-23 MED ORDER — IOPAMIDOL (ISOVUE-M 200) INJECTION 41%
1.0000 mL | Freq: Once | INTRAMUSCULAR | Status: AC
  Administered 2017-12-23: 1 mL via EPIDURAL

## 2017-12-23 MED ORDER — METHYLPREDNISOLONE ACETATE 40 MG/ML INJ SUSP (RADIOLOG
120.0000 mg | Freq: Once | INTRAMUSCULAR | Status: AC
  Administered 2017-12-23: 120 mg via EPIDURAL

## 2017-12-23 NOTE — Discharge Instructions (Signed)

## 2018-01-25 DIAGNOSIS — M47816 Spondylosis without myelopathy or radiculopathy, lumbar region: Secondary | ICD-10-CM | POA: Diagnosis not present

## 2018-02-14 DIAGNOSIS — M47816 Spondylosis without myelopathy or radiculopathy, lumbar region: Secondary | ICD-10-CM | POA: Diagnosis not present

## 2018-03-19 ENCOUNTER — Encounter: Payer: Self-pay | Admitting: Emergency Medicine

## 2018-03-19 ENCOUNTER — Other Ambulatory Visit: Payer: Self-pay

## 2018-03-19 ENCOUNTER — Emergency Department (INDEPENDENT_AMBULATORY_CARE_PROVIDER_SITE_OTHER)
Admission: EM | Admit: 2018-03-19 | Discharge: 2018-03-19 | Disposition: A | Discharge status: Home / Self Care | Payer: BLUE CROSS/BLUE SHIELD | Source: Home / Self Care | Attending: Family Medicine | Admitting: Family Medicine

## 2018-03-19 DIAGNOSIS — H6691 Otitis media, unspecified, right ear: Secondary | ICD-10-CM

## 2018-03-19 DIAGNOSIS — J209 Acute bronchitis, unspecified: Secondary | ICD-10-CM

## 2018-03-19 MED ORDER — AZITHROMYCIN 250 MG PO TABS: 250.0000 mg | ORAL_TABLET | Freq: Every day | ORAL | 0 refills | Status: AC

## 2018-03-19 MED ORDER — BENZONATATE 100 MG PO CAPS: 100.0000 mg | ORAL_CAPSULE | Freq: Three times a day (TID) | ORAL | 0 refills | Status: AC

## 2018-03-19 NOTE — ED Provider Notes (Signed)
[facility]    CSN: [id number] Arrival date & time: 03/19/18  1225     History   Chief Complaint Chief Complaint  Patient presents with  . Nasal Congestion  . Cough    HPI Alyssa [name] is a 37 y.o. female.   HPI Alyssa BattenRebecca [name] is a 37 y.o. female presenting to UC with c/o worsening cough and congestion for 6 days.  Cough is productive with thick white & yellow sputum. She is also c/o Right ear pain and fullness. Denies known fever. Denies n/v/d.    Past Medical History:  Diagnosis Date  . ECZEMA, ATOPIC 10/17/2009  . Headache(784.0) 10/17/2009    Patient Active Problem List   Diagnosis Date Noted  . Routine general medical examination at a health care facility 07/22/2014  . ADD (attention deficit disorder) 09/05/2013  . GERD (gastroesophageal reflux disease) 02/07/2012  . Dysmenorrhea 02/07/2012  . Family history of breast cancer in first degree relative 02/07/2012  . 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 ECZEMA, ATOPIC 10/17/2009  . HEADACHE 10/17/2009    Past Surgical History:  Procedure Laterality Date  . BREAST SURGERY     reduction    OB History   No obstetric history on file.      Home Medications    Prior to Admission medications   Medication Sig Start Date End Date Taking? Authorizing Provider  diclofenac (VOLTAREN) 75 MG EC tablet Take 75 mg by mouth 2 (two) times daily.   Yes [provider]  acyclovir (ZOVIRAX) 200 MG capsule TAKE 1 BY MOUTH 3 TIMES DAILY AS NEEDED 12/09/16   Roderick Peeodd, Jeffrey A, MD  amphetamine-dextroamphetamine (ADDERALL XR) 30 MG 24 hr capsule Take 1 capsule (30 mg total) by mouth daily. 05/27/17   Roderick Peeodd, Jeffrey A, MD  amphetamine-dextroamphetamine (ADDERALL XR) 30 MG 24 hr capsule Take 1 capsule (30 mg total) by mouth daily. 05/27/17   Roderick Peeodd, Jeffrey A, MD  amphetamine-dextroamphetamine (ADDERALL XR) 30 MG 24 hr capsule Take 1 capsule (30 mg total) by mouth daily. 05/27/17   Roderick Peeodd, Jeffrey A, MD  azithromycin  (ZITHROMAX) 250 MG tablet Take 1 tablet (250 mg total) by mouth daily. Take first 2 tablets together, then 1 every day until finished. 03/19/18   Lurene ShadowPhelps, Jameika Kinn O, PA-C  benzonatate (TESSALON) 100 MG capsule Take 1-2 capsules (100-200 mg total) by mouth every 8 (eight) hours. 03/19/18   Lurene ShadowPhelps, Tajanae Guilbault O, PA-C  hydrochlorothiazide (HYDRODIURIL) 25 MG tablet TAKE 1 BY MOUTH DAILY 04/01/16   Roderick Peeodd, Jeffrey A, MD  omeprazole (PRILOSEC) 20 MG capsule TAKE 1 CAPSULE BY MOUTH TWICE DAILY 04/13/17   Roderick Peeodd, Jeffrey A, MD    Family History Family History  Problem Relation Age of Onset  . Cancer Mother        breast  . Hypertension Father   . Hyperlipidemia Father   . Diabetes Father   . Heart disease Father     Social History Social History   Tobacco Use  . Smoking status: Former Smoker    Types: Cigarettes    Last attempt to quit: 03/29/2008    Years since quitting: 9.9  . Smokeless tobacco: Never Used  Substance Use Topics  . Alcohol use: Yes    Alcohol/week: 0.0 standard drinks    Comment: occ  . Drug use: No     Allergies   Penicillins   Review of Systems Review of Systems  Constitutional: Negative for chills and fever.  HENT: Positive for congestion, ear pain, postnasal drip,  sinus pressure, sinus pain and sore throat. Negative for trouble swallowing and voice change.   Respiratory: Positive for cough. Negative for shortness of breath.   Cardiovascular: Negative for chest pain and palpitations.  Gastrointestinal: Negative for abdominal pain, diarrhea, nausea and vomiting.  Musculoskeletal: Negative for arthralgias, back pain and myalgias.  Skin: Negative for rash.  Neurological: Positive for headaches. Negative for dizziness and light-headedness.     Physical Exam Triage Vital Signs ED Triage Vitals  Enc Vitals Group     BP 03/19/18 1328 (!) 138/92     Pulse Rate 03/19/18 1328 91     Resp 03/19/18 1328 18     Temp 03/19/18 1328 98 F (36.7 C)     Temp Source 03/19/18 1328  Oral     SpO2 03/19/18 1328 97 %     Weight 03/19/18 1334 235 lb (106.6 kg)     Height 03/19/18 1334 5\' 4"  (1.626 m)     Head Circumference --      Peak Flow --      Pain Score 03/19/18 1334 0     Pain Loc --      Pain Edu? --      Excl. in GC? --    No data found.  Updated Vital Signs BP (!) 138/92 (BP Location: Right Arm)   Pulse 91   Temp 98 F (36.7 C) (Oral)   Resp 18   Ht 5\' 4"  (1.626 m)   Wt 235 lb (106.6 kg)   LMP 03/18/2018 (Exact Date)   SpO2 97%   BMI 40.34 kg/m   Visual Acuity Right Eye Distance:   Left Eye Distance:   Bilateral Distance:    Right Eye Near:   Left Eye Near:    Bilateral Near:     Physical Exam Vitals signs and nursing note reviewed.  Constitutional:      Appearance: Normal appearance. She is well-developed.  HENT:     Head: Normocephalic and atraumatic.     Right Ear: Tympanic membrane is erythematous and bulging.     Left Ear: Tympanic membrane normal.     Nose: Nose normal.     Right Sinus: No maxillary sinus tenderness or frontal sinus tenderness.     Left Sinus: No maxillary sinus tenderness or frontal sinus tenderness.     Mouth/Throat:     Lips: Pink.     Mouth: Mucous membranes are moist.     Pharynx: Oropharynx is clear. Uvula midline. No pharyngeal swelling, oropharyngeal exudate, posterior oropharyngeal erythema or uvula swelling.  Neck:     Musculoskeletal: Normal range of motion and neck supple.  Cardiovascular:     Rate and Rhythm: Normal rate and regular rhythm.  Pulmonary:     Effort: Pulmonary effort is normal. No respiratory distress.     Breath sounds: No stridor. Wheezing and rhonchi present. No rales.     Comments: Faint diffuse wheeze and rhonchi Musculoskeletal: Normal range of motion.  Skin:    General: Skin is warm and dry.  Neurological:     Mental Status: She is alert and oriented to person, place, and time.  Psychiatric:        Behavior: Behavior normal.      UC Treatments / Results   Labs (all labs ordered are listed, but only abnormal results are displayed) Labs Reviewed - No data to display  EKG None  Radiology No results found.  Procedures Procedures (including critical care time)  Medications Ordered in UC Medications -  No data to display  Initial Impression / Assessment and Plan / UC Course  I have reviewed the triage vital signs and the nursing notes.  Pertinent labs & imaging results that were available during my care of the patient were reviewed by me and considered in my medical decision making (see chart for details).     Hx and exam c/w Right acute otitis media and bronchitis.   Final Clinical Impressions(s) / UC Diagnoses   Final diagnoses:  Acute bronchitis, unspecified organism  Right acute otitis media     Discharge Instructions      You may take 500mg  acetaminophen every 4-6 hours or in combination with ibuprofen 400-600mg  every 6-8 hours as needed for pain, inflammation, and fever.  Be sure to well hydrated with clear liquids and get at least 8 hours of sleep at night, preferably more while sick.   Please follow up with family medicine in 1 week if needed.     ED Prescriptions    Medication Sig Dispense Auth. Provider   azithromycin (ZITHROMAX) 250 MG tablet Take 1 tablet (250 mg total) by mouth daily. Take first 2 tablets together, then 1 every day until finished. 6 tablet Doroteo Glassman, Marton Malizia O, PA-C   benzonatate (TESSALON) 100 MG capsule Take 1-2 capsules (100-200 mg total) by mouth every 8 (eight) hours. 21 capsule Lurene Shadow, PA-C     Controlled Substance Prescriptions [place] Controlled Substance Registry consulted? Not Applicable   Rolla Plate 03/19/18 1456

## 2018-03-19 NOTE — ED Triage Notes (Signed)
Reports worsening congestion and cough over past 6 days. No known fever.

## 2018-03-19 NOTE — Discharge Instructions (Signed)
  You may take 500mg acetaminophen every 4-6 hours or in combination with ibuprofen 400-600mg every 6-8 hours as needed for pain, inflammation, and fever.  Be sure to well hydrated with clear liquids and get at least 8 hours of sleep at night, preferably more while sick.   Please follow up with family medicine in 1 week if needed.   

## 2018-04-20 DIAGNOSIS — M47816 Spondylosis without myelopathy or radiculopathy, lumbar region: Secondary | ICD-10-CM | POA: Diagnosis not present

## 2018-05-27 DIAGNOSIS — Z20828 Contact with and (suspected) exposure to other viral communicable diseases: Secondary | ICD-10-CM | POA: Diagnosis not present

## 2018-06-05 DIAGNOSIS — R030 Elevated blood-pressure reading, without diagnosis of hypertension: Secondary | ICD-10-CM | POA: Diagnosis not present

## 2018-06-05 DIAGNOSIS — M7989 Other specified soft tissue disorders: Secondary | ICD-10-CM | POA: Diagnosis not present

## 2018-06-05 DIAGNOSIS — Z6841 Body Mass Index (BMI) 40.0 and over, adult: Secondary | ICD-10-CM | POA: Diagnosis not present

## 2018-06-05 DIAGNOSIS — M47816 Spondylosis without myelopathy or radiculopathy, lumbar region: Secondary | ICD-10-CM | POA: Diagnosis not present

## 2018-07-21 DIAGNOSIS — Z79899 Other long term (current) drug therapy: Secondary | ICD-10-CM | POA: Diagnosis not present

## 2018-08-07 DIAGNOSIS — H6991 Unspecified Eustachian tube disorder, right ear: Secondary | ICD-10-CM | POA: Diagnosis not present

## 2018-11-10 DIAGNOSIS — Z6841 Body Mass Index (BMI) 40.0 and over, adult: Secondary | ICD-10-CM | POA: Diagnosis not present

## 2018-11-10 DIAGNOSIS — Z7189 Other specified counseling: Secondary | ICD-10-CM | POA: Diagnosis not present

## 2018-11-13 DIAGNOSIS — Z136 Encounter for screening for cardiovascular disorders: Secondary | ICD-10-CM | POA: Diagnosis not present

## 2018-11-13 DIAGNOSIS — R5383 Other fatigue: Secondary | ICD-10-CM | POA: Diagnosis not present

## 2018-11-13 DIAGNOSIS — Z131 Encounter for screening for diabetes mellitus: Secondary | ICD-10-CM | POA: Diagnosis not present

## 2018-11-27 DIAGNOSIS — M47816 Spondylosis without myelopathy or radiculopathy, lumbar region: Secondary | ICD-10-CM | POA: Diagnosis not present

## 2018-12-11 DIAGNOSIS — Z713 Dietary counseling and surveillance: Secondary | ICD-10-CM | POA: Diagnosis not present

## 2018-12-20 DIAGNOSIS — E559 Vitamin D deficiency, unspecified: Secondary | ICD-10-CM | POA: Diagnosis not present

## 2018-12-20 DIAGNOSIS — Z131 Encounter for screening for diabetes mellitus: Secondary | ICD-10-CM | POA: Diagnosis not present

## 2018-12-20 DIAGNOSIS — R5383 Other fatigue: Secondary | ICD-10-CM | POA: Diagnosis not present

## 2018-12-20 DIAGNOSIS — Z13220 Encounter for screening for lipoid disorders: Secondary | ICD-10-CM | POA: Diagnosis not present

## 2019-01-08 DIAGNOSIS — M47816 Spondylosis without myelopathy or radiculopathy, lumbar region: Secondary | ICD-10-CM | POA: Diagnosis not present

## 2019-01-24 ENCOUNTER — Other Ambulatory Visit: Payer: Self-pay

## 2019-01-24 DIAGNOSIS — Z20822 Contact with and (suspected) exposure to covid-19: Secondary | ICD-10-CM

## 2019-01-25 LAB — NOVEL CORONAVIRUS, NAA: SARS-CoV-2, NAA: NOT DETECTED

## 2019-03-05 DIAGNOSIS — M47816 Spondylosis without myelopathy or radiculopathy, lumbar region: Secondary | ICD-10-CM | POA: Diagnosis not present

## 2019-03-05 DIAGNOSIS — R030 Elevated blood-pressure reading, without diagnosis of hypertension: Secondary | ICD-10-CM | POA: Diagnosis not present

## 2019-03-05 DIAGNOSIS — Z6841 Body Mass Index (BMI) 40.0 and over, adult: Secondary | ICD-10-CM | POA: Diagnosis not present

## 2019-03-05 DIAGNOSIS — M7989 Other specified soft tissue disorders: Secondary | ICD-10-CM | POA: Diagnosis not present
